# Patient Record
Sex: Male | Born: 1975 | Race: White | Hispanic: No | Marital: Single | Smoking: Current every day smoker
Health system: Southern US, Community
[De-identification: ages and names within clinical notes are randomized; demographics above are authoritative.]

## PROBLEM LIST (undated history)

## (undated) DIAGNOSIS — C140 Malignant neoplasm of pharynx, unspecified: Secondary | ICD-10-CM

---
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
## 2011-05-09 ENCOUNTER — Emergency Department: Payer: Self-pay | Admitting: Emergency Medicine

## 2011-05-09 LAB — CBC
HCT: 42.5 % (ref 40.0–52.0)
MCH: 31 pg (ref 26.0–34.0)
MCV: 92 fL (ref 80–100)
RDW: 13.1 % (ref 11.5–14.5)
WBC: 6.4 10*3/uL (ref 3.8–10.6)

## 2011-05-09 LAB — URINALYSIS, COMPLETE
Bilirubin,UR: NEGATIVE
Blood: NEGATIVE
Ph: 7 (ref 4.5–8.0)
RBC,UR: NONE SEEN /HPF (ref 0–5)
Specific Gravity: 1.013 (ref 1.003–1.030)
WBC UR: 3 /HPF (ref 0–5)

## 2011-05-09 LAB — COMPREHENSIVE METABOLIC PANEL
Alkaline Phosphatase: 134 U/L (ref 50–136)
Anion Gap: 8 (ref 7–16)
BUN: 12 mg/dL (ref 7–18)
Co2: 30 mmol/L (ref 21–32)
EGFR (African American): >60
Glucose: 122 mg/dL — ABNORMAL HIGH (ref 65–99)
Osmolality: 284 (ref 275–301)
Potassium: 4.2 mmol/L (ref 3.5–5.1)
SGPT (ALT): 32 U/L
Sodium: 142 mmol/L (ref 136–145)

## 2011-05-09 LAB — LIPASE, BLOOD: Lipase: 59 U/L — ABNORMAL LOW (ref 73–393)

## 2011-05-10 ENCOUNTER — Emergency Department: Payer: Self-pay | Admitting: Emergency Medicine

## 2012-09-18 ENCOUNTER — Emergency Department: Payer: Self-pay | Admitting: Internal Medicine

## 2013-10-27 ENCOUNTER — Emergency Department: Payer: Self-pay | Admitting: Emergency Medicine

## 2013-10-30 ENCOUNTER — Emergency Department: Payer: Self-pay | Admitting: Emergency Medicine

## 2014-10-17 IMAGING — CT CT THORACIC SPINE WITHOUT CONTRAST
1 series · 12 of 14 positions shown, 15 images · non-contrast
Comparison: none

REASON FOR EXAM: T11-05-10 decreased height.  severe back pain x 2 day
no hx of injury   flex 4
COMMENTS:   LMP: (Male)

[Series 2: bone · axial · 0.34mm/px · z∈[-746,-458]mm · 12 of 114 slices shown, 15 images]
[im 9/114  soft-tissue]
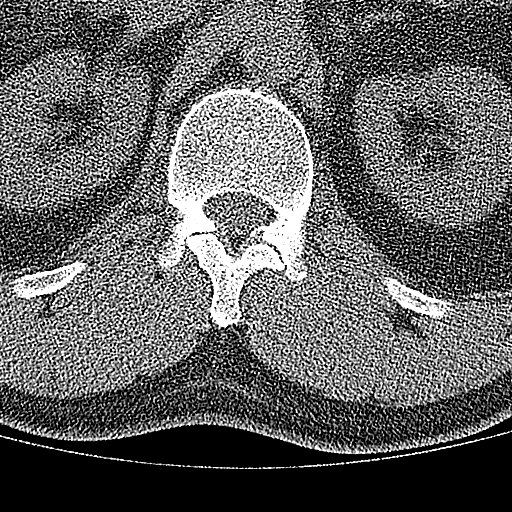
[im 9/114  bone]
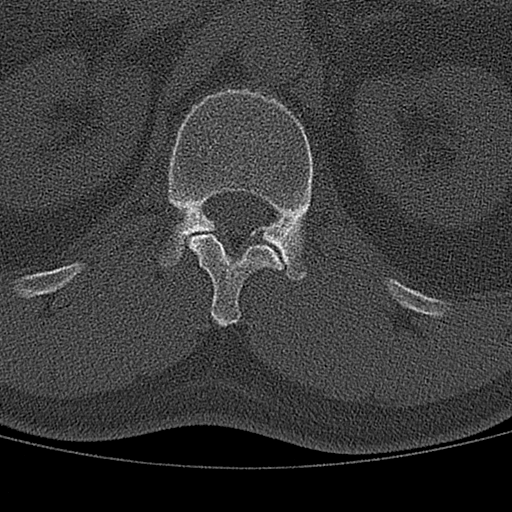
[im 18/114  bone]
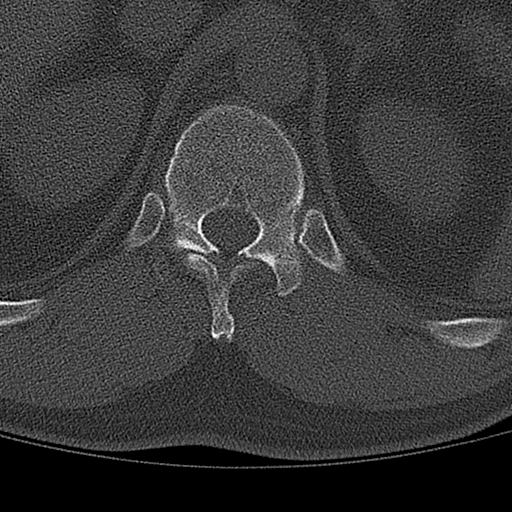
[im 27/114  bone]
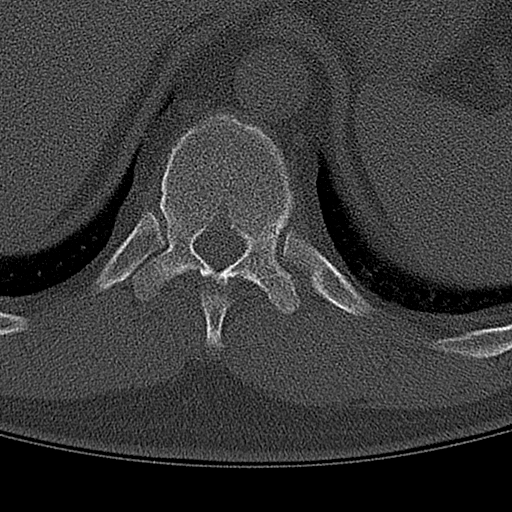
[im 35/114  bone]
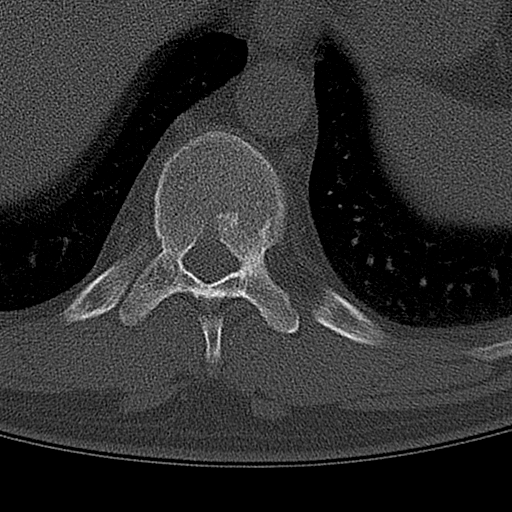
[im 44/114  soft-tissue]
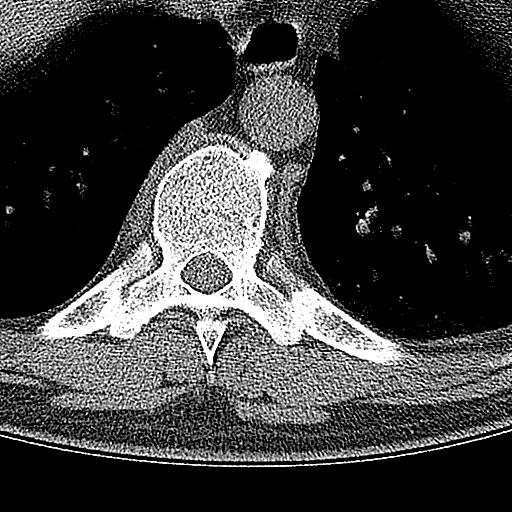
[im 44/114  bone]
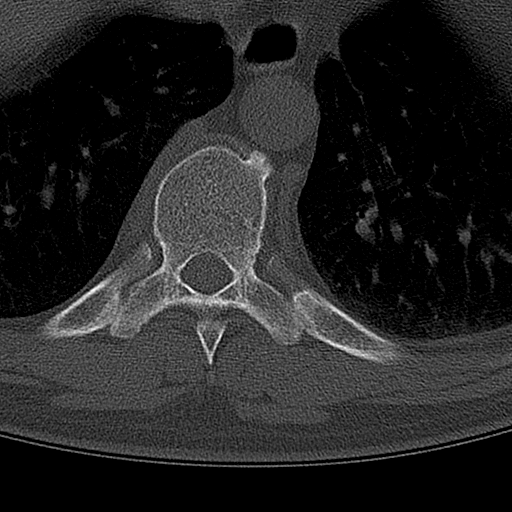
[im 53/114  bone]
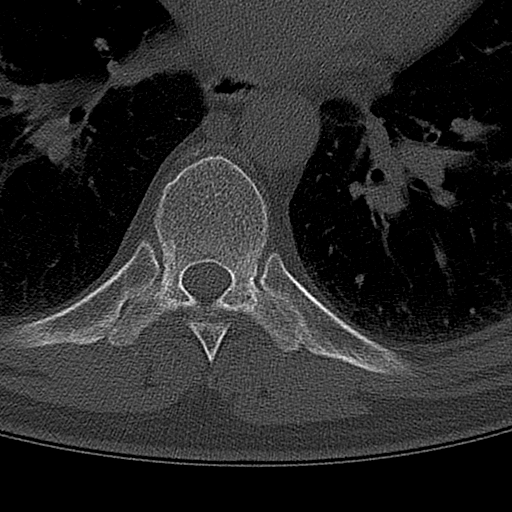
[im 61/114  bone]
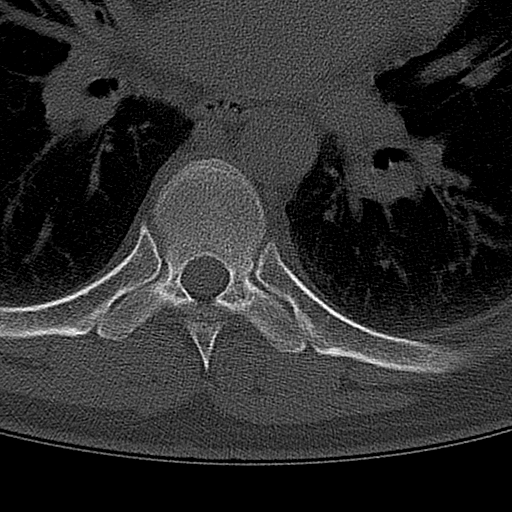
[im 70/114  bone]
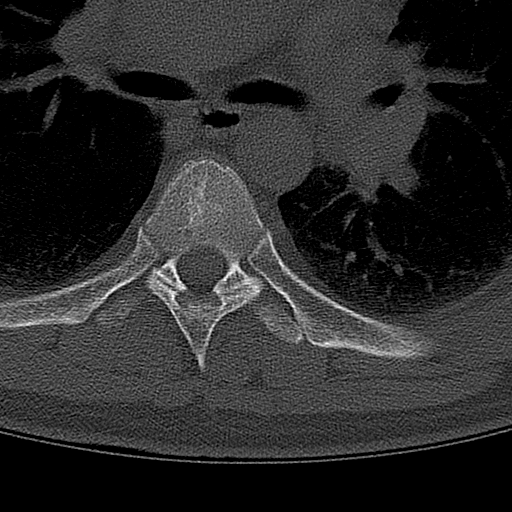
[im 79/114  soft-tissue]
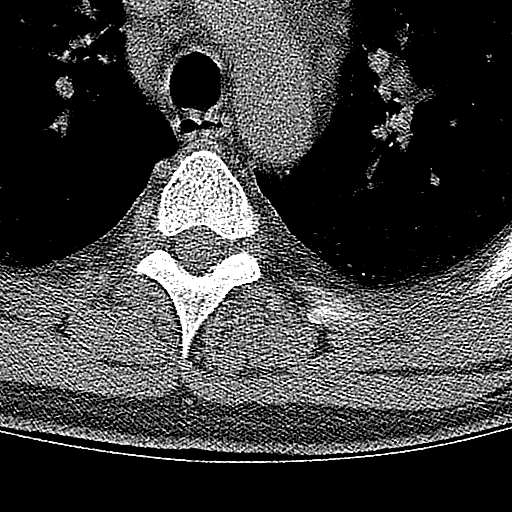
[im 79/114  bone]
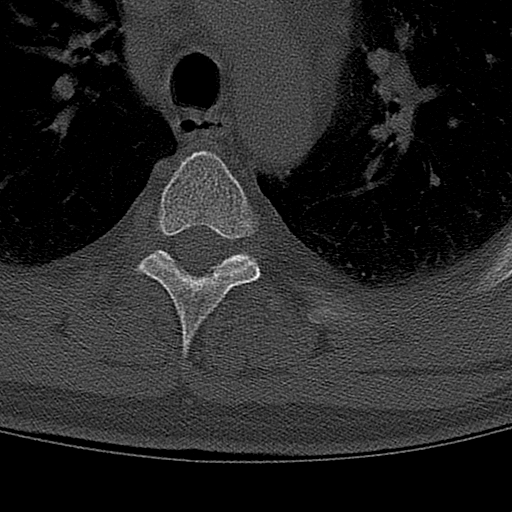
[im 87/114  bone]
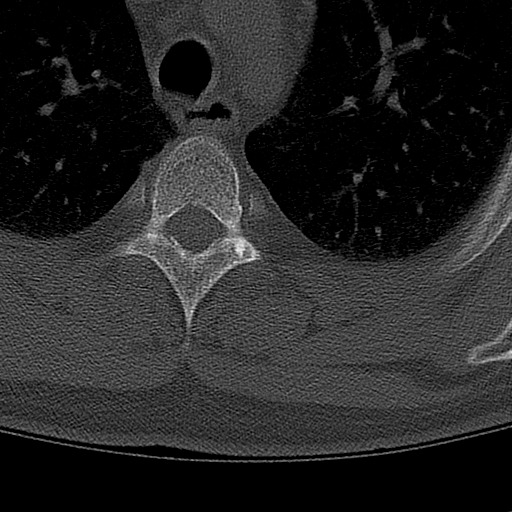
[im 96/114  bone]
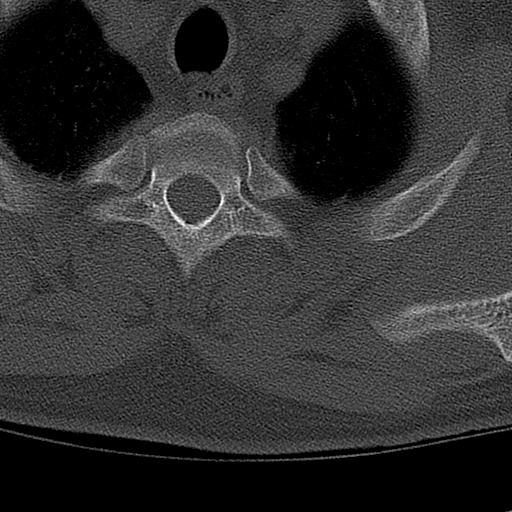
[im 105/114  bone]
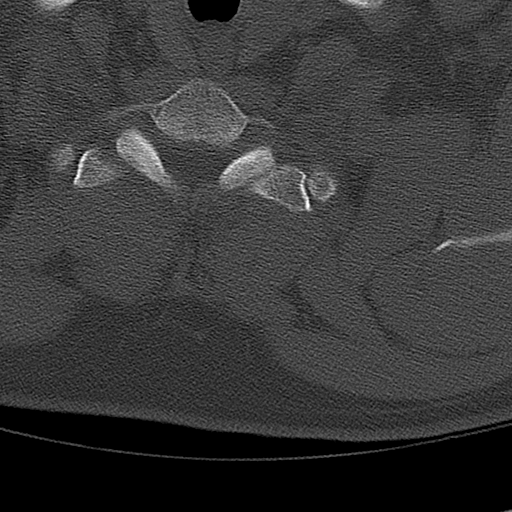

[12 of 14 positions shown; findings below may reference images not displayed]

PROCEDURE:     CT  - CT THORACIC SPINE WO  - September 18, 2012  [DATE]

RESULT:     Sagittal, axial, coronal noncontrast CT images through the
thoracic spine are reviewed. A bone algorithm was employed.

The thoracic vertebral bodies are reasonably well maintained in height.
There is no evidence of an acute or chronic compression fracture. The lower
thoracic vertebral bodies are generally less tall than the upper thoracic
vertebral bodies but the AP dimension is greater. There are Schmorl's nodes
in several mid thoracic levels. No HNP is demonstrated. No abnormal
paravertebral soft tissue densities are demonstrated. There is a left
lateral near bridging osteophyte at T9-T10.
IMPRESSION: There is no evidence of an acute compression fracture.
There are mild degenerative disc changes in the mid and lower thoracic
spine.

[REDACTED]

## 2015-03-23 ENCOUNTER — Emergency Department
Admission: EM | Admit: 2015-03-23 | Discharge: 2015-03-23 | Disposition: A | Discharge status: Home / Self Care | Payer: Self-pay | Attending: Emergency Medicine | Admitting: Emergency Medicine

## 2015-03-23 ENCOUNTER — Emergency Department: Payer: Self-pay

## 2015-03-23 ENCOUNTER — Encounter: Payer: Self-pay | Admitting: Emergency Medicine

## 2015-03-23 DIAGNOSIS — F17200 Nicotine dependence, unspecified, uncomplicated: Secondary | ICD-10-CM | POA: Insufficient documentation

## 2015-03-23 DIAGNOSIS — R079 Chest pain, unspecified: Secondary | ICD-10-CM | POA: Insufficient documentation

## 2015-03-23 LAB — BASIC METABOLIC PANEL
ANION GAP: 6 (ref 5–15)
BUN: 9 mg/dL (ref 6–20)
CO2: 29 mmol/L (ref 22–32)
Calcium: 8.8 mg/dL — ABNORMAL LOW (ref 8.9–10.3)
Chloride: 101 mmol/L (ref 101–111)
Creatinine, Ser: 1.14 mg/dL (ref 0.61–1.24)
GLUCOSE: 104 mg/dL — AB (ref 65–99)
POTASSIUM: 4.3 mmol/L (ref 3.5–5.1)
Sodium: 136 mmol/L (ref 135–145)

## 2015-03-23 LAB — CBC
HEMATOCRIT: 44 % (ref 40.0–52.0)
HEMOGLOBIN: 15 g/dL (ref 13.0–18.0)
MCH: 31.4 pg (ref 26.0–34.0)
MCHC: 34.1 g/dL (ref 32.0–36.0)
MCV: 92.1 fL (ref 80.0–100.0)
Platelets: 214 10*3/uL (ref 150–440)
RBC: 4.78 MIL/uL (ref 4.40–5.90)
RDW: 14 % (ref 11.5–14.5)
WBC: 8.8 10*3/uL (ref 3.8–10.6)

## 2015-03-23 LAB — TROPONIN I: Troponin I: <0.03 ng/mL (ref <0.031)

## 2015-03-23 NOTE — ED Provider Notes (Signed)
[facility] Emergency Department Provider Note     Time seen: ----------------------------------------- 2:30 PM on 03/23/2015 -----------------------------------------    I have reviewed the triage vital signs and the nursing notes.   HISTORY  Chief Complaint Chest Pain    HPI Dustin Webb is a 40 y.o. male who presents the ER stating he had chest pain 12 days ago, recurrent chest tightness yesterday.Patient describes tightness in his chest that radiated into his left arm, states it was his quick onset of snapping her fingers and his quick offset as having affairs again. Family had convinced him to come to the ER for evaluation. Has never had chest pain before, admits to being under a lot stress. He did not have any other associated symptoms.   History reviewed. No pertinent past medical history.  There are no active problems to display for this patient.   History reviewed. No pertinent past surgical history.  Allergies Review of patient's allergies indicates no known allergies.  Social History Social History  Substance Use Topics  . Smoking status: Current Every Day Smoker  . Smokeless tobacco: None  . Alcohol Use: None    Review of Systems Constitutional: Negative for fever. Eyes: Negative for visual changes. ENT: Negative for sore throat. Cardiovascular: positive for chest pain  Respiratory:  Negative for shortness of breath. Gastrointestinal: Negative for abdominal pain, vomiting and diarrhea. Genitourinary: Negative for dysuria. Musculoskeletal: Negative for back pain. Skin: Negative for rash. Neurological: Negative for headaches, focal weakness or numbness.  10-point ROS otherwise negative.  ____________________________________________   PHYSICAL EXAM:  VITAL SIGNS: ED Triage Vitals  Enc Vitals Group     BP --      Pulse --      Resp --      Temp --      Temp src --      SpO2 --      Weight --      Height --      Head Cir --      Peak Flow --      Pain Score --      Pain Loc --      Pain Edu? --      Excl. in [place]? --     Constitutional: Alert and oriented. Well appearing and in no distress. Eyes: Conjunctivae are normal. PERRL. Normal extraocular movements. ENT   Head: Normocephalic and atraumatic.   Nose: No congestion/rhinnorhea.   Mouth/Throat: Mucous membranes are moist.   Neck: No stridor. Cardiovascular: Normal rate, regular rhythm. Normal and symmetric distal pulses are present in all extremities. No murmurs, rubs, or gallops. Respiratory: Normal respiratory effort without tachypnea nor retractions. Breath sounds are clear and equal bilaterally. No wheezes/rales/rhonchi. Gastrointestinal: Soft and nontender. No distention. No abdominal bruits.  Musculoskeletal: Nontender with normal range of motion in all extremities. No joint effusions.  No lower extremity tenderness nor edema. Neurologic:  Normal speech and language. No gross focal neurologic deficits are appreciated. Speech is normal. No gait instability. Skin:  Skin is warm, dry and intact. No rash noted. Psychiatric: Mood and affect are normal. Speech and behavior are normal. Patient exhibits appropriate insight and judgment. ____________________________________________  EKG: Interpreted by me. Normal sinus rhythm with rate of 91 bpm, normal PR interval, normal QRS, normal QT interval. Normal axis.  ____________________________________________  ED COURSE:  Pertinent labs & imaging results that were available during my care of the patient were reviewed by me and considered in my medical decision making (  see chart for details).  patient's distress, will check cardiac labs and reevaluate.  ____________________________________________    LABS (pertinent positives/negatives)  Labs Reviewed  BASIC METABOLIC PANEL - Abnormal; Notable for the following:    Glucose, Bld 104 (*)    Calcium 8.8 (*)    All other  components within normal limits  CBC  TROPONIN I    RADIOLOGY   Chest x-ray  Is unremarkable ____________________________________________  FINAL ASSESSMENT AND PLAN   CHEST PAIN  Plan: Patient with labs and imaging as dictated above. Patient is low risk for ACS, we'll refer him to cardiology for follow-up.   Earleen Newport, MD   Earleen Newport, MD 03/23/15 [phone]

## 2015-03-23 NOTE — ED Notes (Signed)
Had chest pain 12 days ago , with recurrent chest tightness last being yesterday

## 2015-03-23 NOTE — Discharge Instructions (Signed)
Nonspecific Chest Pain  °Chest pain can be caused by many different conditions. There is always a chance that your pain could be related to something serious, such as a heart attack or a blood clot in your lungs. Chest pain can also be caused by conditions that are not life-threatening. If you have chest pain, it is very important to follow up with your health care provider. °CAUSES  °Chest pain can be caused by: °· Heartburn. °· Pneumonia or bronchitis. °· Anxiety or stress. °· Inflammation around your heart (pericarditis) or lung (pleuritis or pleurisy). °· A blood clot in your lung. °· A collapsed lung (pneumothorax). It can develop suddenly on its own (spontaneous pneumothorax) or from trauma to the chest. °· Shingles infection (varicella-zoster virus). °· Heart attack. °· Damage to the bones, muscles, and cartilage that make up your chest wall. This can include: °¨ Bruised bones due to injury. °¨ Strained muscles or cartilage due to frequent or repeated coughing or overwork. °¨ Fracture to one or more ribs. °¨ Sore cartilage due to inflammation (costochondritis). °RISK FACTORS  °Risk factors for chest pain may include: °· Activities that increase your risk for trauma or injury to your chest. °· Respiratory infections or conditions that cause frequent coughing. °· Medical conditions or overeating that can cause heartburn. °· Heart disease or family history of heart disease. °· Conditions or health behaviors that increase your risk of developing a blood clot. °· Having had chicken pox (varicella zoster). °SIGNS AND SYMPTOMS °Chest pain can feel like: °· Burning or tingling on the surface of your chest or deep in your chest. °· Crushing, pressure, aching, or squeezing pain. °· Dull or sharp pain that is worse when you move, cough, or take a deep breath. °· Pain that is also felt in your back, neck, shoulder, or arm, or pain that spreads to any of these areas. °Your chest pain may come and go, or it may stay  constant. °DIAGNOSIS °Lab tests or other studies may be needed to find the cause of your pain. Your health care provider may have you take a test called an ambulatory ECG (electrocardiogram). An ECG records your heartbeat patterns at the time the test is performed. You may also have other tests, such as: °· Transthoracic echocardiogram (TTE). During echocardiography, sound waves are used to create a picture of all of the heart structures and to look at how blood flows through your heart. °· Transesophageal echocardiogram (TEE). This is a more advanced imaging test that obtains images from inside your body. It allows your health care provider to see your heart in finer detail. °· Cardiac monitoring. This allows your health care provider to monitor your heart rate and rhythm in real time. °· Holter monitor. This is a portable device that records your heartbeat and can help to diagnose abnormal heartbeats. It allows your health care provider to track your heart activity for several days, if needed. °· Stress tests. These can be done through exercise or by taking medicine that makes your heart beat more quickly. °· Blood tests. °· Imaging tests. °TREATMENT  °Your treatment depends on what is causing your chest pain. Treatment may include: °· Medicines. These may include: °¨ Acid blockers for heartburn. °¨ Anti-inflammatory medicine. °¨ Pain medicine for inflammatory conditions. °¨ Antibiotic medicine, if an infection is present. °¨ Medicines to dissolve blood clots. °¨ Medicines to treat coronary artery disease. °· Supportive care for conditions that do not require medicines. This may include: °¨ Resting. °¨ Applying heat   or cold packs to injured areas. °¨ Limiting activities until pain decreases. °HOME CARE INSTRUCTIONS °· If you were prescribed an antibiotic medicine, finish it all even if you start to feel better. °· Avoid any activities that bring on chest pain. °· Do not use any tobacco products, including  cigarettes, chewing tobacco, or electronic cigarettes. If you need help quitting, ask your health care provider. °· Do not drink alcohol. °· Take medicines only as directed by your health care provider. °· Keep all follow-up visits as directed by your health care provider. This is important. This includes any further testing if your chest pain does not go away. °· If heartburn is the cause for your chest pain, you may be told to keep your head raised (elevated) while sleeping. This reduces the chance that acid will go from your stomach into your esophagus. °· Make lifestyle changes as directed by your health care provider. These may include: °¨ Getting regular exercise. Ask your health care provider to suggest some activities that are safe for you. °¨ Eating a heart-healthy diet. A registered dietitian can help you to learn healthy eating options. °¨ Maintaining a healthy weight. °¨ Managing diabetes, if necessary. °¨ Reducing stress. °SEEK MEDICAL CARE IF: °· Your chest pain does not go away after treatment. °· You have a rash with blisters on your chest. °· You have a fever. °SEEK IMMEDIATE MEDICAL CARE IF:  °· Your chest pain is worse. °· You have an increasing cough, or you cough up blood. °· You have severe abdominal pain. °· You have severe weakness. °· You faint. °· You have chills. °· You have sudden, unexplained chest discomfort. °· You have sudden, unexplained discomfort in your arms, back, neck, or jaw. °· You have shortness of breath at any time. °· You suddenly start to sweat, or your skin gets clammy. °· You feel nauseous or you vomit. °· You suddenly feel light-headed or dizzy. °· Your heart begins to beat quickly, or it feels like it is skipping beats. °These symptoms may represent a serious problem that is an emergency. Do not wait to see if the symptoms will go away. Get medical help right away. Call your local emergency services (911 in the [place] Do not drive yourself to the hospital. °  °This  information is not intended to replace advice given to you by your health care provider. Make sure you discuss any questions you have with your health care provider. °  °Document Released: 10/21/2004 Document Revised: 02/01/2014 Document Reviewed: 08/17/2013 °Elsevier Interactive Patient Education ©2016 [employer] ° °

## 2016-03-26 ENCOUNTER — Emergency Department
Admission: EM | Admit: 2016-03-26 | Discharge: 2016-03-26 | Disposition: A | Discharge status: Home / Self Care | Payer: BLUE CROSS/BLUE SHIELD | Attending: Emergency Medicine | Admitting: Emergency Medicine

## 2016-03-26 ENCOUNTER — Encounter: Payer: Self-pay | Admitting: Emergency Medicine

## 2016-03-26 DIAGNOSIS — F17200 Nicotine dependence, unspecified, uncomplicated: Secondary | ICD-10-CM | POA: Insufficient documentation

## 2016-03-26 DIAGNOSIS — K029 Dental caries, unspecified: Secondary | ICD-10-CM | POA: Diagnosis not present

## 2016-03-26 DIAGNOSIS — K0889 Other specified disorders of teeth and supporting structures: Secondary | ICD-10-CM | POA: Diagnosis present

## 2016-03-26 DIAGNOSIS — K5909 Other constipation: Secondary | ICD-10-CM

## 2016-03-26 DIAGNOSIS — A691 Other Vincent's infections: Secondary | ICD-10-CM

## 2016-03-26 MED ORDER — METRONIDAZOLE 500 MG PO TABS: 500.0000 mg | ORAL_TABLET | Freq: Three times a day (TID) | ORAL | 0 refills | Status: DC

## 2016-03-26 MED ORDER — PENICILLIN V POTASSIUM 500 MG PO TABS: 500.0000 mg | ORAL_TABLET | Freq: Four times a day (QID) | ORAL | 0 refills | Status: DC

## 2016-03-26 MED ORDER — SENNOSIDES-DOCUSATE SODIUM 8.6-50 MG PO TABS: 2.0000 | ORAL_TABLET | Freq: Every day | ORAL | 0 refills | Status: DC

## 2016-03-26 MED ORDER — METOCLOPRAMIDE HCL 10 MG PO TABS: 10.0000 mg | ORAL_TABLET | Freq: Four times a day (QID) | ORAL | 0 refills | Status: DC | PRN

## 2016-03-26 NOTE — ED Provider Notes (Signed)
[facility] Emergency Department Provider Note  ____________________________________________  Time seen: Approximately 10:39 AM  I have reviewed the triage vital signs and the nursing notes.   HISTORY  Chief Complaint Dental Pain    HPI Dustin Webb is a 41 y.o. male who complains of dental pain since yesterday which was gradual onset. This morning when he woke up he noticed that the right side of his jaw was also swollen. Denies any difficulty swallowing or breathing. No fever or chills or sweats. No eye pain or vision changes. 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  Patient has chronic constipation, reports last bowel movement 8 days ago. He is under the care of his primary care doctor for this and tried multiple different dietary supplements and laxatives.  Not currently taking anything. Tolerating oral intake without vomiting. Has persistent nausea over greater than a week.   History reviewed. No pertinent past medical history. Chronic constipation  There are no active problems to display for this patient.    History reviewed. No pertinent surgical history. Remote eye surgery  Prior to Admission medications   Medication Sig Start Date End Date Taking? Authorizing Provider  metoCLOPramide (REGLAN) 10 MG tablet Take 1 tablet (10 mg total) by mouth every 6 (six) hours as needed. 03/26/16   Carrie Mew, MD  metroNIDAZOLE (FLAGYL) 500 MG tablet Take 1 tablet (500 mg total) by mouth 3 (three) times daily. 03/26/16   Carrie Mew, MD  penicillin v potassium (VEETID) 500 MG tablet Take 1 tablet (500 mg total) by mouth 4 (four) times daily. 03/26/16   Carrie Mew, MD  senna-docusate (SENOKOT-S) 8.6-50 MG tablet Take 2 tablets by mouth daily. 03/26/16   Carrie Mew, MD     Allergies Patient has no known allergies.   History reviewed. No pertinent family  history.  Social History Social History  Substance Use Topics  . Smoking status: Current Every Day Smoker  . Smokeless tobacco: Never Used  . Alcohol use No    Review of Systems  Constitutional:   No fever or chills.  ENT:   No sore throat. No rhinorrhea. Cardiovascular:   No chest pain. Respiratory:   No dyspnea or cough. Gastrointestinal:   Negative for abdominal pain, vomiting and diarrhea. Subacute to chronic nausea and constipation. 10-point ROS otherwise negative.  ____________________________________________   PHYSICAL EXAM:  VITAL SIGNS: ED Triage Vitals  Enc Vitals Group     BP 03/26/16 1012 (!) 168/91     Pulse Rate 03/26/16 1012 82     Resp 03/26/16 1012 18     Temp 03/26/16 1012 98.3 F (36.8 C)     Temp Source 03/26/16 1012 Oral     SpO2 03/26/16 1012 98 %     Weight 03/26/16 1013 230 lb (104.3 kg)     Height 03/26/16 1013 5\' 11"  (1.803 m)     Head Circumference --      Peak Flow --      Pain Score 03/26/16 1009 10     Pain Loc --      Pain Edu? --      Excl. in [place]? --     Vital signs reviewed, nursing assessments reviewed.   Constitutional:   Alert and oriented. Well appearing and in no distress. Eyes:   No scleral icterus. No conjunctival pallor. PERRL. EOMIAnd painless.  No nystagmus. ENT   Head:  Normocephalic and atraumatic. There is mild swelling of the right face over the mandible. TMs normal, external canals normal   Nose:   No congestion/rhinnorhea. No septal hematoma   Mouth/Throat:   MMM, no pharyngeal erythema. No peritonsillar mass. Diffuse dental decay with multiple necrotic stumps of teeth. The central and lateral incisors are present on the lower, with diffuse necrotizing ulcerative plaques at the gingiva. Teeth are not loose. Not tender. In the right lower jaw, there is gingival swelling and tenderness. No fluctuance or purulent drainage.   Neck:   No stridor. No SubQ emphysema. No meningismus. No mass in the  submandibular or submental space. Hematological/Lymphatic/Immunilogical:   No cervical lymphadenopathy. Cardiovascular:   RRR. Symmetric bilateral radial and DP pulses.  No murmurs.  Respiratory:   Normal respiratory effort without tachypnea nor retractions. Breath sounds are clear and equal bilaterally. No wheezes/rales/rhonchi. Gastrointestinal:   Soft and nontender. Non distended. There is no CVA tenderness.  No rebound, rigidity, or guarding. Not tympanitic Neurologic:   Normal speech and language.  CN 2-10 normal. Motor grossly intact. No gross focal neurologic deficits are appreciated.   ____________________________________________    LABS (pertinent positives/negatives) (all labs ordered are listed, but only abnormal results are displayed) Labs Reviewed - No data to display ____________________________________________   EKG    ____________________________________________    RADIOLOGY  No results found.  ____________________________________________   PROCEDURES Procedures  ____________________________________________   INITIAL IMPRESSION / ASSESSMENT AND PLAN / ED COURSE  Pertinent labs & imaging results that were available during my care of the patient were reviewed by me and considered in my medical decision making (see chart for details).  Patient presents with acute gingivitis with facial swelling in the setting of a periodontal infection. No evidence of abscess at this time. Patient is nontoxic, well-appearing, unremarkable vital signs. No evidence of airway involvement or eye involvement. He has very poor dentition, and exam also suggests possible necrotizing ulcerative gingivitis. For these issues, I'll prescribe him penicillin and Flagyl. Also Reglan and Senokot-S for his nausea and chronic constipation. I also recommended he pick up MiraLAX and continue following up with his doctor regarding this issue.   Considering the patient's symptoms, medical  history, and physical examination today, I have low suspicion for cholecystitis or biliary pathology, pancreatitis, perforation or bowel obstruction, hernia, intra-abdominal abscess, AAA or dissection, volvulus or intussusception, mesenteric ischemia, or appendicitis.           ____________________________________________   FINAL CLINICAL IMPRESSION(S) / ED DIAGNOSES  Final diagnoses:  Dental caries  Acute necrotizing ulcerative gingivitis  Chronic constipation      New Prescriptions   METOCLOPRAMIDE (REGLAN) 10 MG TABLET    Take 1 tablet (10 mg total) by mouth every 6 (six) hours as needed.   METRONIDAZOLE (FLAGYL) 500 MG TABLET    Take 1 tablet (500 mg total) by mouth 3 (three) times daily.   PENICILLIN V POTASSIUM (VEETID) 500 MG TABLET    Take 1 tablet (500 mg total) by mouth 4 (four) times daily.   SENNA-DOCUSATE (SENOKOT-S) 8.6-50 MG TABLET    Take 2 tablets by mouth daily.     Portions of this note were generated with dragon dictation software. Dictation errors may occur despite best attempts at proofreading.    Carrie Mew, MD 03/26/16 1047

## 2016-03-26 NOTE — ED Triage Notes (Signed)
Pt to ed with c/o right sided toothache x 2 days. Swelling noted to right side of face.

## 2016-07-15 ENCOUNTER — Other Ambulatory Visit: Payer: Self-pay

## 2016-08-04 ENCOUNTER — Ambulatory Visit (INDEPENDENT_AMBULATORY_CARE_PROVIDER_SITE_OTHER): Payer: BLUE CROSS/BLUE SHIELD | Admitting: Urology

## 2016-08-04 ENCOUNTER — Encounter: Payer: Self-pay | Admitting: Urology

## 2016-08-04 VITALS — BP 131/77 | HR 77 | Ht 71.0 in | Wt 243.5 lb

## 2016-08-04 DIAGNOSIS — E291 Testicular hypofunction: Secondary | ICD-10-CM

## 2016-08-04 LAB — URINALYSIS, COMPLETE
Bilirubin, UA: NEGATIVE
Glucose, UA: NEGATIVE
Ketones, UA: NEGATIVE
LEUKOCYTES UA: NEGATIVE
Nitrite, UA: NEGATIVE
PROTEIN UA: NEGATIVE
Specific Gravity, UA: <1.005 — ABNORMAL LOW (ref 1.005–1.030)
Urobilinogen, Ur: 0.2 mg/dL (ref 0.2–1.0)
pH, UA: 6 (ref 5.0–7.5)

## 2016-08-04 LAB — MICROSCOPIC EXAMINATION: Bacteria, UA: NONE SEEN

## 2016-08-04 NOTE — Progress Notes (Signed)
08/04/2016 1:55 PM   Dustin Webb 1975/12/25 [id number]  Referring provider: Center, [facility] [place], [place] [address]  Chief Complaint  Patient presents with  . New Patient (Initial Visit)    Hypofunction    HPI: The patient's is a 41 year old gentleman who is on chronic methadone presents today for evaluation of his hypogonadism.  He has been on testosterone injections in the past though is not currently taking them. It is unclear what his testosterone level is at baseline. His primary care provider checked a free testosterone recently but not a total testosterone. The patient does state that every time his testosterone has been checked that it was low. He currently complains of severe fatigue, decreased libido, erectile dysfunction. He is very bothered by this. He has no children. He is not currently interested in having a child at any point. When he was on the injections, he noticed a little improvement though he was getting them only once per month and often did not take a regular basis. He is interested in resuming therapy at this point.   PMH: History reviewed. No pertinent past medical history.  Surgical History: Past Surgical History:  Procedure Laterality Date  . CATARACT EXTRACTION Right 1976    Home Medications:  Allergies as of 08/04/2016   No Known Allergies     Medication List       Accurate as of 08/04/16  1:55 PM. Always use your most recent med list.          methadone 10 MG/ML solution Commonly known as:  DOLOPHINE Place 100 mg into feeding tube daily.   metoCLOPramide 10 MG tablet Commonly known as:  REGLAN Take 1 tablet (10 mg total) by mouth every 6 (six) hours as needed.   metroNIDAZOLE 500 MG tablet Commonly known as:  FLAGYL Take 1 tablet (500 mg total) by mouth 3 (three) times daily.   penicillin v potassium 500 MG tablet Commonly known as:  VEETID Take 1 tablet (500 mg total) by mouth 4 (four)  times daily.   senna-docusate 8.6-50 MG tablet Commonly known as:  Senokot-S Take 2 tablets by mouth daily.       Allergies: No Known Allergies  Family History: Family History  Problem Relation Age of Onset  . Prostate cancer Neg Hx   . Bladder Cancer Neg Hx   . Kidney cancer Neg Hx     Social History:  reports that he has been smoking.  He has never used smokeless tobacco. He reports that he does not drink alcohol or use drugs.  ROS: UROLOGY Frequent Urination?: Yes Hard to postpone urination?: No Burning/pain with urination?: No Get up at night to urinate?: Yes Leakage of urine?: No Urine stream starts and stops?: No Trouble starting stream?: No Do you have to strain to urinate?: No Blood in urine?: No Urinary tract infection?: No Sexually transmitted disease?: No Injury to kidneys or bladder?: No Painful intercourse?: No Weak stream?: No Erection problems?: Yes Penile pain?: No  Gastrointestinal Nausea?: No Vomiting?: No Indigestion/heartburn?: No Diarrhea?: No Constipation?: No  Constitutional Fever: No Night sweats?: No Weight loss?: No Fatigue?: No  Skin Skin rash/lesions?: No Itching?: No  Eyes Blurred vision?: No Double vision?: No  Ears/Nose/Throat Sore throat?: No Sinus problems?: No  Hematologic/Lymphatic Swollen glands?: No Easy bruising?: No  Cardiovascular Leg swelling?: No Chest pain?: No  Respiratory Cough?: No Shortness of breath?: No  Endocrine Excessive thirst?: No  Musculoskeletal Back pain?: No Joint  pain?: No  Neurological Headaches?: No Dizziness?: No  Psychologic Depression?: No Anxiety?: No  Physical Exam: BP 131/77 (BP Location: Left Arm, Patient Position: Sitting, Cuff Size: Normal)   Pulse 77   Ht 5\' 11"  (1.803 m)   Wt 243 lb 8 oz (110.5 kg)   BMI 33.96 kg/m   Constitutional:  Alert and oriented, No acute distress. HEENT: [place] AT, moist mucus membranes.  Trachea midline, no  masses. Cardiovascular: No clubbing, cyanosis, or edema. Respiratory: Normal respiratory effort, no increased work of breathing. GI: Abdomen is soft, nontender, nondistended, no abdominal masses GU: No CVA tenderness. Normal phallus. Testicles descended bilaterally. Benign. Skin: No rashes, bruises or suspicious lesions. Lymph: No cervical or inguinal adenopathy. Neurologic: Grossly intact, no focal deficits, moving all 4 extremities. Psychiatric: Normal mood and affect.  Laboratory Data: Lab Results  Component Value Date   WBC 8.8 03/23/2015   HGB 15.0 03/23/2015   HCT 44.0 03/23/2015   MCV 92.1 03/23/2015   PLT 214 03/23/2015    Lab Results  Component Value Date   CREATININE 1.14 03/23/2015    No results found for: PSA  No results found for: TESTOSTERONE  No results found for: HGBA1C  Urinalysis    Component Value Date/Time   COLORURINE Yellow 05/09/2011 1750   APPEARANCEUR Clear 05/09/2011 1750   LABSPEC 1.013 05/09/2011 1750   PHURINE 7.0 05/09/2011 1750   GLUCOSEU Negative 05/09/2011 1750   HGBUR Negative 05/09/2011 1750   BILIRUBINUR Negative 05/09/2011 1750   KETONESUR Negative 05/09/2011 1750   PROTEINUR Negative 05/09/2011 1750   NITRITE Negative 05/09/2011 1750   LEUKOCYTESUR Trace 05/09/2011 1750    Assessment & Plan:    1. Low testosterone I discussed the role of testosterone replacement therapy as a means treat low testosterone. We discussed different options for this including exogenous testosterone versus Clomid. He does understand the advantages Clomid his anesthetic maintains fertility. He however is not interested in home at this time and would like to try testosterone injections. He does understand that this will decrease his fertility. He also understands other risks of testosterone therapy include stroke, cardiovascular disease, theoretical risk of promoting prostate cancer, need for frequent follow-up. At this point, we will confirm his low  testosterone with 2 AM testosterone levels. We'll also order standard pre-testosterone therapy labs. We will call him with the results and start him on injection therapy if appropriate. If started on medication, we'll plan for follow-up with labs 3 months after initiation of therapy.  Nickie Retort, MD  [facility] [address], [place], [place] [address] [phone]

## 2016-08-10 ENCOUNTER — Other Ambulatory Visit: Payer: BLUE CROSS/BLUE SHIELD

## 2016-08-10 DIAGNOSIS — E291 Testicular hypofunction: Secondary | ICD-10-CM

## 2016-08-11 LAB — TESTOSTERONE: Testosterone: 110 ng/dL — ABNORMAL LOW (ref 264–916)

## 2016-08-17 ENCOUNTER — Other Ambulatory Visit: Payer: BLUE CROSS/BLUE SHIELD

## 2016-08-17 DIAGNOSIS — E291 Testicular hypofunction: Secondary | ICD-10-CM

## 2016-08-18 ENCOUNTER — Telehealth: Payer: Self-pay | Admitting: Family Medicine

## 2016-08-18 LAB — HEPATIC FUNCTION PANEL
ALK PHOS: 139 IU/L — AB (ref 39–117)
ALT: 26 IU/L (ref 0–44)
AST: 23 IU/L (ref 0–40)
Albumin: 4.4 g/dL (ref 3.5–5.5)
BILIRUBIN, DIRECT: 0.15 mg/dL (ref 0.00–0.40)
Bilirubin Total: 0.5 mg/dL (ref 0.0–1.2)
Total Protein: 7 g/dL (ref 6.0–8.5)

## 2016-08-18 LAB — CBC WITH DIFFERENTIAL
BASOS: 0 %
Basophils Absolute: 0 10*3/uL (ref 0.0–0.2)
EOS (ABSOLUTE): 0.1 10*3/uL (ref 0.0–0.4)
EOS: 1 %
HEMOGLOBIN: 15.7 g/dL (ref 13.0–17.7)
Hematocrit: 43.7 % (ref 37.5–51.0)
IMMATURE GRANS (ABS): 0 10*3/uL (ref 0.0–0.1)
Immature Granulocytes: 0 %
LYMPHS: 19 %
Lymphocytes Absolute: 1.8 10*3/uL (ref 0.7–3.1)
MCH: 31.4 pg (ref 26.6–33.0)
MCHC: 35.9 g/dL — ABNORMAL HIGH (ref 31.5–35.7)
MCV: 87 fL (ref 79–97)
MONOCYTES: 7 %
Monocytes Absolute: 0.6 10*3/uL (ref 0.1–0.9)
NEUTROS ABS: 6.8 10*3/uL (ref 1.4–7.0)
NEUTROS PCT: 73 %
RBC: 5 x10E6/uL (ref 4.14–5.80)
RDW: 13.5 % (ref 12.3–15.4)
WBC: 9.3 10*3/uL (ref 3.4–10.8)

## 2016-08-18 LAB — LIPID PANEL
CHOL/HDL RATIO: 5.7 ratio — AB (ref 0.0–5.0)
Cholesterol, Total: 171 mg/dL (ref 100–199)
HDL: 30 mg/dL — AB (ref >39)
LDL Calculated: 115 mg/dL — ABNORMAL HIGH (ref 0–99)
TRIGLYCERIDES: 132 mg/dL (ref 0–149)
VLDL CHOLESTEROL CAL: 26 mg/dL (ref 5–40)

## 2016-08-18 LAB — TESTOSTERONE,FREE AND TOTAL
TESTOSTERONE FREE: 3 pg/mL — AB (ref 6.8–21.5)
Testosterone: 196 ng/dL — ABNORMAL LOW (ref 264–916)

## 2016-08-18 LAB — FSH/LH
FSH: 3.5 m[IU]/mL (ref 1.5–12.4)
LH: 3.9 m[IU]/mL (ref 1.7–8.6)

## 2016-08-18 LAB — PSA: PROSTATE SPECIFIC AG, SERUM: 0.3 ng/mL (ref 0.0–4.0)

## 2016-08-18 LAB — PROLACTIN: Prolactin: 8 ng/mL (ref 4.0–15.2)

## 2016-08-18 NOTE — Telephone Encounter (Signed)
Left message for patient to return call.

## 2016-08-18 NOTE — Telephone Encounter (Signed)
-----   Message from Nickie Retort, MD sent at 08/18/2016 10:59 AM EDT ----- Please let patient know his labs confirmed low T. Can we set the patient up to receive testosterone cypionate 200 mg q2weeks with both Rx and injection appts? He will then need to see me in 3 months with CBC and Testosterone level prior. Thanks    ----- Message ----- From: Leia Alf, CMA Sent: 08/18/2016   8:21 AM To: Nickie Retort, MD    ----- Message ----- From: Interface, [facility] Lab Results In Sent: 08/18/2016   5:42 AM To: [facility]

## 2016-08-24 NOTE — Telephone Encounter (Signed)
LMOM

## 2016-08-25 MED ORDER — TESTOSTERONE CYPIONATE 200 MG/ML IM SOLN: 200.0000 mg | INTRAMUSCULAR | 0 refills | Status: DC

## 2016-08-25 NOTE — Telephone Encounter (Signed)
Patient notified

## 2016-08-25 NOTE — Telephone Encounter (Signed)
LMOM

## 2016-08-25 NOTE — Addendum Note (Signed)
Addended by: Kyra Manges on: 08/25/2016 01:02 PM   Modules accepted: Orders

## 2016-08-26 ENCOUNTER — Telehealth: Payer: Self-pay

## 2016-08-26 NOTE — Telephone Encounter (Signed)
Spoke with pt in reference to PA. Pt voiced understanding. Will call pt when PA is complete.

## 2016-08-26 NOTE — Telephone Encounter (Signed)
LMOM- Spoke with pharmacy. Testosterone requires a PA. Unless pt wants to pay out of pocket. Pt will not be able to get an injection tomorrow.

## 2016-08-27 ENCOUNTER — Ambulatory Visit: Payer: BLUE CROSS/BLUE SHIELD

## 2016-09-01 ENCOUNTER — Telehealth: Payer: Self-pay | Admitting: Urology

## 2016-09-01 NOTE — Telephone Encounter (Signed)
Pt called asking about PA for testosterone.  I read the last note that someone would call him when we received this.

## 2016-09-08 NOTE — Telephone Encounter (Signed)
Submitted the PA to [facility] and obtained APPROVAL.  Contacted the [employer] on [address] to confirm.  Will be ready for pickup by 3pm today.  Advised patient.  He has an appointment for an injection tomorrow morning.

## 2016-09-09 ENCOUNTER — Ambulatory Visit (INDEPENDENT_AMBULATORY_CARE_PROVIDER_SITE_OTHER): Payer: BLUE CROSS/BLUE SHIELD

## 2016-09-09 DIAGNOSIS — E291 Testicular hypofunction: Secondary | ICD-10-CM | POA: Diagnosis not present

## 2016-09-09 MED ORDER — TESTOSTERONE CYPIONATE 200 MG/ML IM SOLN
200.0000 mg | Freq: Once | INTRAMUSCULAR | Status: AC
  Administered 2016-09-09: 200 mg via INTRAMUSCULAR

## 2016-09-09 NOTE — Progress Notes (Signed)
Testosterone IM Injection  Due to Hypogonadism patient is present today for a Testosterone Injection.  Medication: Testosterone Cypionate Dose: 1mL Location: right upper outer buttocks Lot: [id number] Exp:01/2018  Patient tolerated well, no complications were noted  Preformed by: Chelsea Watkins, LPN   Follow up: 2 weeks 

## 2016-09-23 ENCOUNTER — Ambulatory Visit: Payer: BLUE CROSS/BLUE SHIELD

## 2016-09-28 ENCOUNTER — Ambulatory Visit (INDEPENDENT_AMBULATORY_CARE_PROVIDER_SITE_OTHER): Payer: BLUE CROSS/BLUE SHIELD

## 2016-09-28 DIAGNOSIS — E291 Testicular hypofunction: Secondary | ICD-10-CM

## 2016-09-28 MED ORDER — TESTOSTERONE CYPIONATE 200 MG/ML IM SOLN
200.0000 mg | Freq: Once | INTRAMUSCULAR | Status: AC
  Administered 2016-09-28: 200 mg via INTRAMUSCULAR

## 2016-09-28 NOTE — Progress Notes (Signed)
Testosterone IM Injection  Due to Hypogonadism patient is present today for a Testosterone Injection.  Medication: Testosterone Cypionate Dose: 71mL Location: left upper outer buttocks Lot: [id number] Exp:02/20  Patient tolerated well, no complications were noted  Preformed by: Toniann Fail, LPN   Follow up: 2 weeks

## 2016-09-29 ENCOUNTER — Telehealth: Payer: Self-pay | Admitting: Urology

## 2016-09-29 NOTE — Telephone Encounter (Signed)
Dr. Pilar Jarvis wanted to see Dustin Webb in three months after he started his testosterone therapy.  I do not see an appointment for this appointment in the computer.  Please have him make one.

## 2016-09-30 NOTE — Telephone Encounter (Signed)
Will have pt make appt for November at next injection appt.

## 2016-10-12 ENCOUNTER — Ambulatory Visit (INDEPENDENT_AMBULATORY_CARE_PROVIDER_SITE_OTHER): Payer: BLUE CROSS/BLUE SHIELD

## 2016-10-12 DIAGNOSIS — E291 Testicular hypofunction: Secondary | ICD-10-CM

## 2016-10-12 MED ORDER — TESTOSTERONE CYPIONATE 200 MG/ML IM SOLN
200.0000 mg | Freq: Once | INTRAMUSCULAR | Status: AC
  Administered 2016-10-12: 200 mg via INTRAMUSCULAR

## 2016-10-12 NOTE — Progress Notes (Signed)
Testosterone IM Injection  Due to Hypogonadism patient is present today for a Testosterone Injection.  Medication: Testosterone Cypionate Dose: 41mL Location: right upper outer buttocks Lot: [id number] Exp:11/2018  Patient tolerated well, no complications were noted  Preformed by: Toniann Fail, LPN   Follow up: 2 weeks. Pt made f/u for Dr. Pilar Jarvis in November.

## 2016-10-26 ENCOUNTER — Ambulatory Visit (INDEPENDENT_AMBULATORY_CARE_PROVIDER_SITE_OTHER): Payer: BLUE CROSS/BLUE SHIELD

## 2016-10-26 DIAGNOSIS — E291 Testicular hypofunction: Secondary | ICD-10-CM

## 2016-10-26 MED ORDER — TESTOSTERONE CYPIONATE 200 MG/ML IM SOLN
200.0000 mg | Freq: Once | INTRAMUSCULAR | Status: AC
  Administered 2016-10-26: 200 mg via INTRAMUSCULAR

## 2016-10-26 NOTE — Progress Notes (Signed)
Testosterone IM Injection  Due to Hypogonadism patient is present today for a Testosterone Injection.  Medication: Testosterone Cypionate Dose: 67mL Location: left upper outer buttocks Lot: [id number] Exp:11/2018  Patient tolerated well, no complications were noted  Preformed by: Toniann Fail, LPN   Follow up: 2 weeks

## 2016-11-09 ENCOUNTER — Ambulatory Visit (INDEPENDENT_AMBULATORY_CARE_PROVIDER_SITE_OTHER): Payer: BLUE CROSS/BLUE SHIELD

## 2016-11-09 DIAGNOSIS — E291 Testicular hypofunction: Secondary | ICD-10-CM

## 2016-11-09 MED ORDER — TESTOSTERONE CYPIONATE 200 MG/ML IM SOLN
200.0000 mg | Freq: Once | INTRAMUSCULAR | Status: AC
  Administered 2016-11-09: 200 mg via INTRAMUSCULAR

## 2016-11-09 NOTE — Progress Notes (Signed)
Testosterone IM Injection  Due to Hypogonadism patient is present today for a Testosterone Injection.  Medication: Testosterone Cypionate Dose: 24mL Location: right upper outer buttocks Lot: [id number] Exp:11/2018  Patient tolerated well, no complications were noted  Preformed by: Toniann Fail, LPN   Follow up: 2 weeks

## 2016-11-23 ENCOUNTER — Ambulatory Visit (INDEPENDENT_AMBULATORY_CARE_PROVIDER_SITE_OTHER): Payer: BLUE CROSS/BLUE SHIELD

## 2016-11-23 DIAGNOSIS — E291 Testicular hypofunction: Secondary | ICD-10-CM | POA: Diagnosis not present

## 2016-11-23 MED ORDER — TESTOSTERONE CYPIONATE 200 MG/ML IM SOLN
200.0000 mg | Freq: Once | INTRAMUSCULAR | Status: AC
  Administered 2016-11-23: 200 mg via INTRAMUSCULAR

## 2016-11-23 NOTE — Progress Notes (Signed)
Testosterone IM Injection  Due to Hypogonadism patient is present today for a Testosterone Injection.  Medication: Testosterone Cypionate Dose: 58mL Location: left upper outer buttocks Lot: [id number] Exp:11/2018  Patient tolerated well, no complications were noted  Preformed by: Toniann Fail, LPN   Follow up: 2 weeks

## 2016-11-25 ENCOUNTER — Ambulatory Visit: Payer: BLUE CROSS/BLUE SHIELD

## 2016-12-03 ENCOUNTER — Ambulatory Visit: Payer: BLUE CROSS/BLUE SHIELD

## 2016-12-06 ENCOUNTER — Encounter: Payer: Self-pay | Admitting: Urology

## 2016-12-06 ENCOUNTER — Ambulatory Visit: Payer: BLUE CROSS/BLUE SHIELD

## 2016-12-10 ENCOUNTER — Encounter: Payer: Self-pay | Admitting: Urology

## 2016-12-10 ENCOUNTER — Ambulatory Visit: Payer: BLUE CROSS/BLUE SHIELD | Admitting: Urology

## 2017-02-03 ENCOUNTER — Ambulatory Visit (INDEPENDENT_AMBULATORY_CARE_PROVIDER_SITE_OTHER): Payer: BLUE CROSS/BLUE SHIELD | Admitting: Urology

## 2017-02-03 ENCOUNTER — Encounter: Payer: Self-pay | Admitting: Urology

## 2017-02-03 VITALS — BP 125/81 | HR 73 | Ht 71.0 in | Wt 241.1 lb

## 2017-02-03 DIAGNOSIS — E291 Testicular hypofunction: Secondary | ICD-10-CM

## 2017-02-03 NOTE — Progress Notes (Signed)
02/03/2017 1:44 PM   Dustin Webb [birth year] [id number]  Referring provider: Center, [facility] [name], [place] [address]  Chief Complaint  Patient presents with  . Hypogonadism    HPI: The patient's is a 42 year old gentleman who is on chronic methadone presents today for follow up of his hypogonadism.  He has been on testosterone injections in the past though is not currently taking them. It is unclear what his testosterone level is at baseline. His primary care provider checked a free testosterone recently but not a total testosterone. The patient does state that every time his testosterone has been checked that it was low. He currently complains of severe fatigue, decreased libido, erectile dysfunction. He is very bothered by this. He has no children. He is not currently interested in having a child at any point. When he was on the injections, he noticed a little improvement though he was getting them only once per month and often did not take a regular basis.   He was resumed on testosterone therapy in July 2018.  He stopped receiving his testosterone injections in October 2018.  He has not been seen in follow-up since that time.  He has had no lab levels drawn since July 2018 due to his noncompliance.  He is unsure of the medication helped him when he was on it.  However again his labs were never rechecked   PMH: History reviewed. No pertinent past medical history.  Surgical History: Past Surgical History:  Procedure Laterality Date  . CATARACT EXTRACTION Right 1976    Home Medications:  Allergies as of 02/03/2017   No Known Allergies     Medication List        Accurate as of 02/03/17  1:44 PM. Always use your most recent med list.          methadone 10 MG/5ML solution Commonly known as:  DOLOPHINE Take by mouth daily.   testosterone cypionate 200 MG/ML injection Commonly known as:  DEPOTESTOSTERONE CYPIONATE Inject 1 mL (200  mg total) into the muscle every 14 (fourteen) days.       Allergies: No Known Allergies  Family History: Family History  Problem Relation Age of Onset  . Prostate cancer Neg Hx   . Bladder Cancer Neg Hx   . Kidney cancer Neg Hx     Social History:  reports that he has been smoking.  he has never used smokeless tobacco. He reports that he does not drink alcohol or use drugs.  ROS: UROLOGY Frequent Urination?: No Hard to postpone urination?: No Burning/pain with urination?: No Get up at night to urinate?: No Leakage of urine?: No Urine stream starts and stops?: No Trouble starting stream?: No Do you have to strain to urinate?: No Blood in urine?: No Urinary tract infection?: No Sexually transmitted disease?: No Injury to kidneys or bladder?: No Painful intercourse?: No Weak stream?: No Erection problems?: No Penile pain?: No  Gastrointestinal Nausea?: No Vomiting?: No Indigestion/heartburn?: No Diarrhea?: No Constipation?: No  Constitutional Fever: No Night sweats?: No Weight loss?: No Fatigue?: No  Skin Skin rash/lesions?: No Itching?: No  Eyes Blurred vision?: No Double vision?: No  Ears/Nose/Throat Sore throat?: No Sinus problems?: No  Hematologic/Lymphatic Swollen glands?: No Easy bruising?: No  Cardiovascular Leg swelling?: No Chest pain?: No  Respiratory Cough?: No Shortness of breath?: No  Endocrine Excessive thirst?: No  Musculoskeletal Back pain?: No Joint pain?: No  Neurological Headaches?: No Dizziness?: No  Psychologic Depression?: No  Anxiety?: No  Physical Exam: BP 125/81 (BP Location: Right Arm, Patient Position: Sitting, Cuff Size: Large)   Pulse 73   Ht 5\' 11"  (1.803 m)   Wt 241 lb 1.6 oz (109.4 kg)   BMI 33.63 kg/m   Constitutional:  Alert and oriented, No acute distress. HEENT: [place] AT, moist mucus membranes.  Trachea midline, no masses. Cardiovascular: No clubbing, cyanosis, or edema. Respiratory: Normal  respiratory effort, no increased work of breathing. GI: Abdomen is soft, nontender, nondistended, no abdominal masses GU: No CVA tenderness.  Skin: No rashes, bruises or suspicious lesions. Lymph: No cervical or inguinal adenopathy. Neurologic: Grossly intact, no focal deficits, moving all 4 extremities. Psychiatric: Normal mood and affect.  Laboratory Data: Lab Results  Component Value Date   WBC 9.3 08/17/2016   HGB 15.7 08/17/2016   HCT 43.7 08/17/2016   MCV 87 08/17/2016   PLT 214 03/23/2015    Lab Results  Component Value Date   CREATININE 1.14 03/23/2015    No results found for: PSA  Lab Results  Component Value Date   TESTOSTERONE 196 (L) 08/17/2016    No results found for: HGBA1C  Urinalysis    Component Value Date/Time   COLORURINE Yellow 05/09/2011 1750   APPEARANCEUR Clear 08/04/2016 1321   LABSPEC 1.013 05/09/2011 1750   PHURINE 7.0 05/09/2011 1750   GLUCOSEU Negative 08/04/2016 1321   GLUCOSEU Negative 05/09/2011 1750   HGBUR Negative 05/09/2011 1750   BILIRUBINUR Negative 08/04/2016 1321   BILIRUBINUR Negative 05/09/2011 1750   KETONESUR Negative 05/09/2011 1750   PROTEINUR Negative 08/04/2016 1321   PROTEINUR Negative 05/09/2011 1750   NITRITE Negative 08/04/2016 1321   NITRITE Negative 05/09/2011 1750   LEUKOCYTESUR Negative 08/04/2016 1321   LEUKOCYTESUR Trace 05/09/2011 1750    Assessment & Plan:    1.  Hypogonadism I discussed with the patient that testosterone replacement therapy takes compliance and appropriate follow-up.  We did discuss that this medication is not one to start and stop.  We also did discuss that it is not appropriate to miss his appointments to check labs.  We will have him follow-up for an a.m. lab draw since his labs are 98 months old.  If this confirms continued low testosterone, we will resume his testosterone replacement therapy.  He will need to receive his injections in the office due to his noncompliance.  I did  discuss with him that if he is noncompliant again that we will no longer be able to provide him with testosterone in this office.  Return for labs before 9:30 AM.  Nickie Retort, MD  [facility] [address] [place], [place] [address] [phone]

## 2017-02-08 ENCOUNTER — Other Ambulatory Visit: Payer: BLUE CROSS/BLUE SHIELD

## 2017-02-08 DIAGNOSIS — E291 Testicular hypofunction: Secondary | ICD-10-CM

## 2017-02-09 LAB — CBC WITH DIFFERENTIAL
BASOS: 0 %
Basophils Absolute: 0 10*3/uL (ref 0.0–0.2)
EOS (ABSOLUTE): 0.1 10*3/uL (ref 0.0–0.4)
EOS: 2 %
Hematocrit: 42.9 % (ref 37.5–51.0)
Hemoglobin: 14.4 g/dL (ref 13.0–17.7)
IMMATURE GRANS (ABS): 0 10*3/uL (ref 0.0–0.1)
Immature Granulocytes: 0 %
LYMPHS: 33 %
Lymphocytes Absolute: 2.2 10*3/uL (ref 0.7–3.1)
MCH: 30.4 pg (ref 26.6–33.0)
MCHC: 33.6 g/dL (ref 31.5–35.7)
MCV: 91 fL (ref 79–97)
Monocytes Absolute: 0.6 10*3/uL (ref 0.1–0.9)
Monocytes: 8 %
NEUTROS ABS: 3.9 10*3/uL (ref 1.4–7.0)
NEUTROS PCT: 57 %
RBC: 4.73 x10E6/uL (ref 4.14–5.80)
RDW: 13.9 % (ref 12.3–15.4)
WBC: 6.9 10*3/uL (ref 3.4–10.8)

## 2017-02-09 LAB — PSA: Prostate Specific Ag, Serum: 0.2 ng/mL (ref 0.0–4.0)

## 2017-02-09 LAB — HEPATIC FUNCTION PANEL
ALBUMIN: 4.2 g/dL (ref 3.5–5.5)
ALK PHOS: 123 IU/L — AB (ref 39–117)
ALT: 18 IU/L (ref 0–44)
AST: 15 IU/L (ref 0–40)
Bilirubin Total: 0.2 mg/dL (ref 0.0–1.2)
Bilirubin, Direct: 0.08 mg/dL (ref 0.00–0.40)
TOTAL PROTEIN: 6.5 g/dL (ref 6.0–8.5)

## 2017-02-09 LAB — TESTOSTERONE: Testosterone: 144 ng/dL — ABNORMAL LOW (ref 264–916)

## 2017-02-11 ENCOUNTER — Telehealth: Payer: Self-pay | Admitting: Urology

## 2017-02-11 NOTE — Telephone Encounter (Signed)
Per your approval I went over his labs with him on the phone and scheduled  him for testosterone training with [facility].  Thanks, Sharyn Lull

## 2017-02-14 ENCOUNTER — Ambulatory Visit (INDEPENDENT_AMBULATORY_CARE_PROVIDER_SITE_OTHER): Payer: BLUE CROSS/BLUE SHIELD | Admitting: Family Medicine

## 2017-02-14 DIAGNOSIS — E291 Testicular hypofunction: Secondary | ICD-10-CM

## 2017-02-14 MED ORDER — TESTOSTERONE CYPIONATE 200 MG/ML IM SOLN
200.0000 mg | Freq: Once | INTRAMUSCULAR | Status: AC
  Administered 2017-02-14: 200 mg via INTRAMUSCULAR

## 2017-02-14 NOTE — Progress Notes (Signed)
Testosterone IM Injection  Due to Hypogonadism patient is present today for a Testosterone Injection.  Medication: Testosterone Cypionate Dose: 1.32ml Location: left upper outer buttocks Lot: [id number] Exp:4/20  Patient tolerated well, no complications were noted  Preformed by: Elberta Leatherwood, CMA  Follow up: 2 weeks

## 2017-02-28 ENCOUNTER — Ambulatory Visit (INDEPENDENT_AMBULATORY_CARE_PROVIDER_SITE_OTHER): Payer: BLUE CROSS/BLUE SHIELD

## 2017-02-28 DIAGNOSIS — E291 Testicular hypofunction: Secondary | ICD-10-CM | POA: Diagnosis not present

## 2017-02-28 MED ORDER — TESTOSTERONE CYPIONATE 200 MG/ML IM SOLN
200.0000 mg | Freq: Once | INTRAMUSCULAR | Status: AC
  Administered 2017-02-28: 200 mg via INTRAMUSCULAR

## 2017-02-28 NOTE — Progress Notes (Signed)
Testosterone IM Injection  Due to Hypogonadism patient is present today for a Testosterone Injection.  Medication: Testosterone Cypionate Dose: 85mL Location: right upper outer buttocks Lot: [id number] Exp:03/2018  Patient tolerated well, no complications were noted  Preformed by: Toniann Fail, LPN   Follow up: 2 weeks

## 2017-03-14 ENCOUNTER — Ambulatory Visit: Payer: BLUE CROSS/BLUE SHIELD

## 2017-03-15 ENCOUNTER — Telehealth: Payer: Self-pay | Admitting: Urology

## 2017-03-15 ENCOUNTER — Ambulatory Visit: Payer: BLUE CROSS/BLUE SHIELD

## 2017-03-15 NOTE — Telephone Encounter (Signed)
Patient needs a refill on his testosterone medication he was coming in for an injection today but I cancelled it until he can get more medication.  Sharyn Lull

## 2017-03-17 ENCOUNTER — Other Ambulatory Visit: Payer: Self-pay

## 2017-03-17 MED ORDER — TESTOSTERONE CYPIONATE 200 MG/ML IM SOLN: 200.0000 mg | INTRAMUSCULAR | 0 refills | Status: DC

## 2017-03-17 NOTE — Telephone Encounter (Signed)
Medication refilled

## 2017-03-18 ENCOUNTER — Ambulatory Visit (INDEPENDENT_AMBULATORY_CARE_PROVIDER_SITE_OTHER): Payer: BLUE CROSS/BLUE SHIELD | Admitting: Family Medicine

## 2017-03-18 DIAGNOSIS — E291 Testicular hypofunction: Secondary | ICD-10-CM

## 2017-03-18 MED ORDER — TESTOSTERONE CYPIONATE 200 MG/ML IM SOLN
200.0000 mg | Freq: Once | INTRAMUSCULAR | Status: AC
  Administered 2017-03-18: 200 mg via INTRAMUSCULAR

## 2017-03-18 NOTE — Progress Notes (Signed)
Testosterone IM Injection  Due to Hypogonadism patient is present today for a Testosterone Injection.  Medication: Testosterone Cypionate Dose: 51ml Location: left upper outer buttocks Lot: [id number] Exp:06/2018  Patient tolerated well, no complications were noted  Preformed by: Elberta Leatherwood, CMA  Follow up: 2 weeks

## 2017-04-04 ENCOUNTER — Ambulatory Visit: Payer: BLUE CROSS/BLUE SHIELD

## 2017-04-08 ENCOUNTER — Ambulatory Visit (INDEPENDENT_AMBULATORY_CARE_PROVIDER_SITE_OTHER): Payer: BLUE CROSS/BLUE SHIELD

## 2017-04-08 DIAGNOSIS — E291 Testicular hypofunction: Secondary | ICD-10-CM

## 2017-04-08 MED ORDER — TESTOSTERONE CYPIONATE 200 MG/ML IM SOLN
200.0000 mg | Freq: Once | INTRAMUSCULAR | Status: AC
  Administered 2017-04-08: 200 mg via INTRAMUSCULAR

## 2017-04-08 NOTE — Progress Notes (Signed)
Testosterone IM Injection  Due to Hypogonadism patient is present today for a Testosterone Injection.  Medication: Testosterone Cypionate Dose: 16mL Location: right upper outer buttocks Lot: [id number] Exp:06/2018  Patient tolerated well, no complications were noted  Preformed by: Toniann Fail, LPN   Follow up: 2 weeks

## 2017-04-20 IMAGING — CR DG CHEST 2V
2 series · 2 of 2 positions shown · non-contrast
Comparison: None.

CLINICAL DATA: Chest tightness.  Pain and pressure in left arm.

EXAM:
CHEST  2 VIEW

[chest pa]
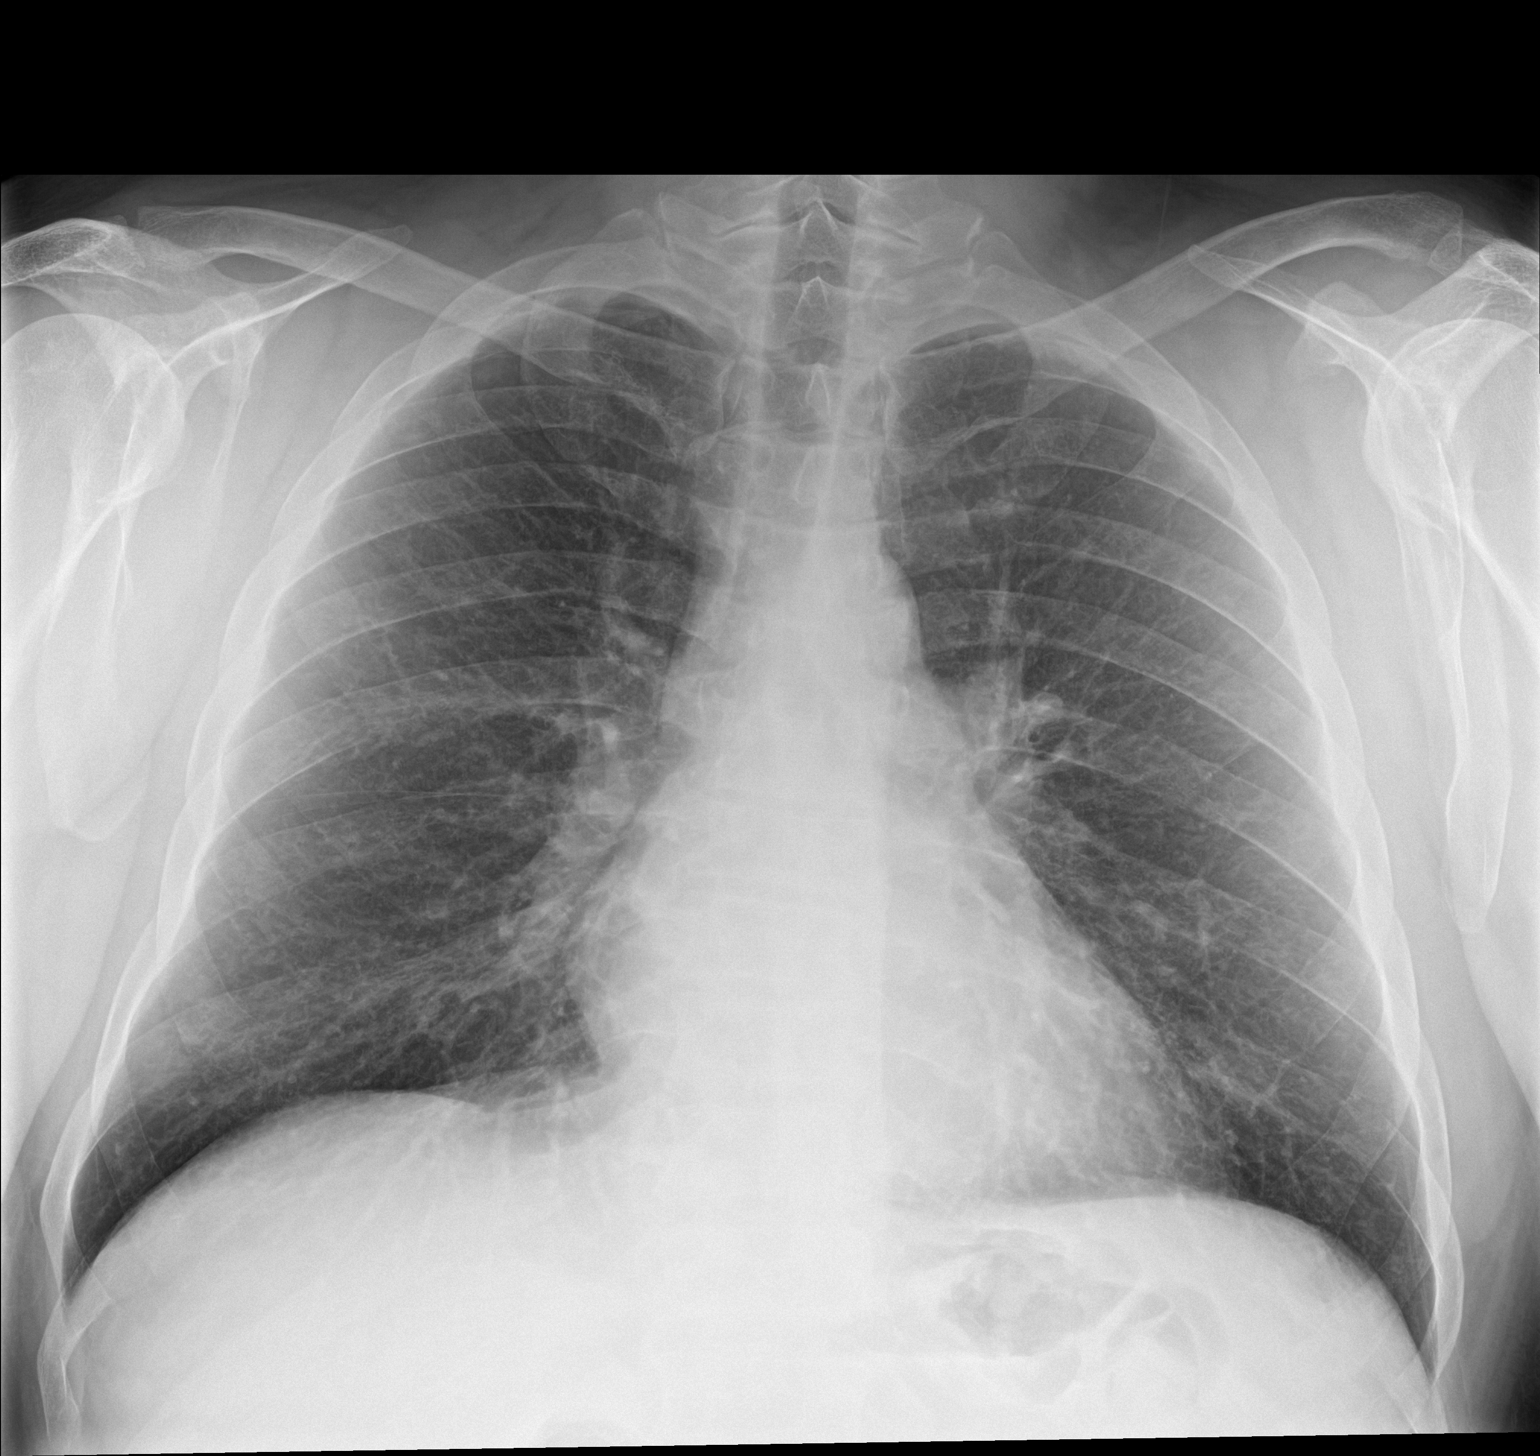

[chest lat]
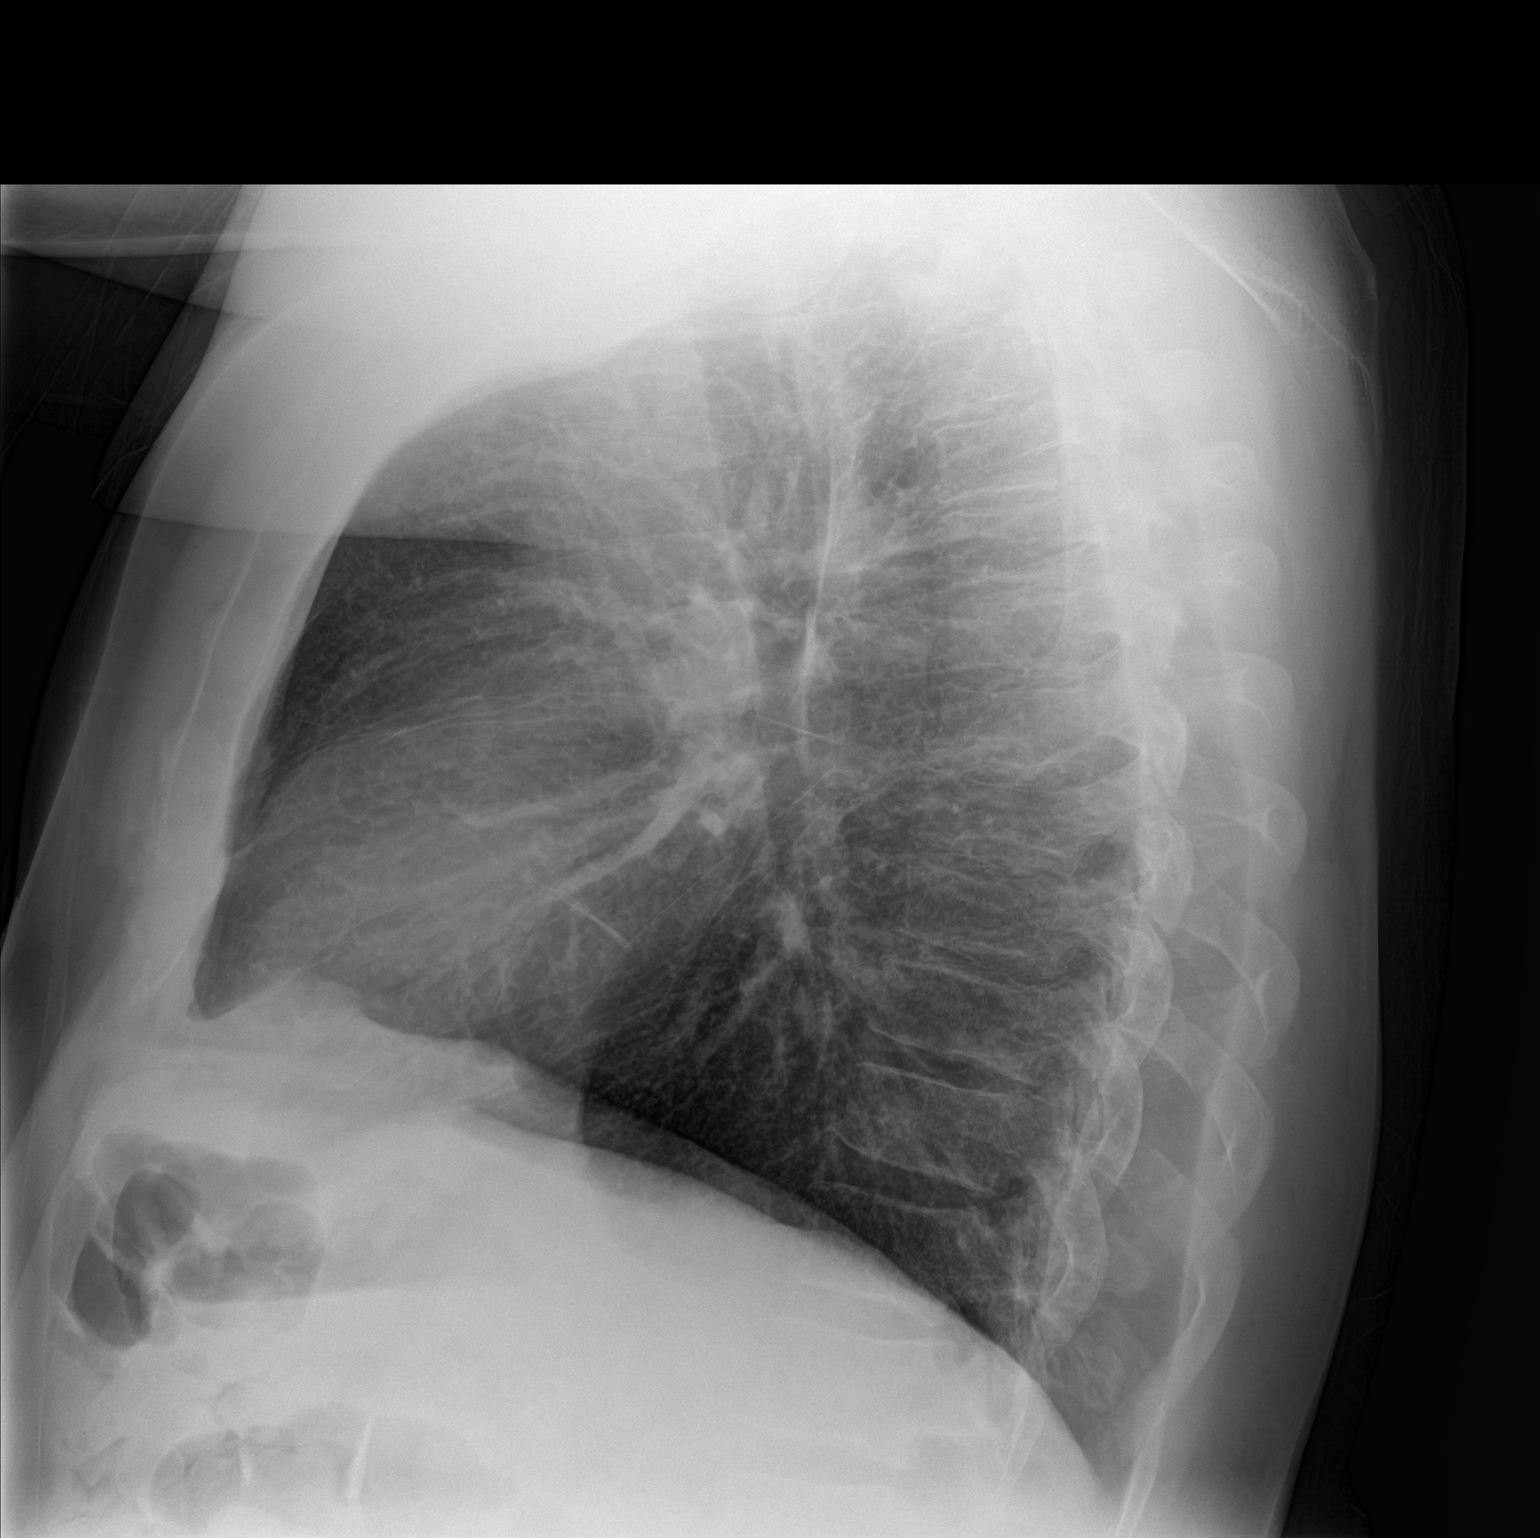

[2 of 2 positions shown; findings below may reference images not displayed]

FINDINGS: Heart and mediastinal contours are within normal limits. No focal
opacities or effusions. No acute bony abnormality.
IMPRESSION: No active cardiopulmonary disease.

## 2017-04-22 ENCOUNTER — Ambulatory Visit (INDEPENDENT_AMBULATORY_CARE_PROVIDER_SITE_OTHER): Payer: BLUE CROSS/BLUE SHIELD

## 2017-04-22 DIAGNOSIS — E291 Testicular hypofunction: Secondary | ICD-10-CM | POA: Diagnosis not present

## 2017-04-22 MED ORDER — TESTOSTERONE CYPIONATE 200 MG/ML IM SOLN
200.0000 mg | Freq: Once | INTRAMUSCULAR | Status: AC
  Administered 2017-04-22: 200 mg via INTRAMUSCULAR

## 2017-04-22 NOTE — Progress Notes (Signed)
Testosterone IM Injection  Due to Hypogonadism patient is present today for a Testosterone Injection.  Medication: Testosterone Cypionate Dose: 18mL Location: left upper outer buttocks Lot: [id number] Exp:06/2018  Patient tolerated well, no complications were noted  Preformed by: Toniann Fail, LPN   Follow up: 2 weeks

## 2017-05-09 ENCOUNTER — Ambulatory Visit: Payer: BLUE CROSS/BLUE SHIELD

## 2017-05-09 ENCOUNTER — Encounter: Payer: Self-pay | Admitting: Urology

## 2017-05-12 ENCOUNTER — Ambulatory Visit (INDEPENDENT_AMBULATORY_CARE_PROVIDER_SITE_OTHER): Payer: BLUE CROSS/BLUE SHIELD

## 2017-05-12 DIAGNOSIS — E291 Testicular hypofunction: Secondary | ICD-10-CM

## 2017-05-12 LAB — BLADDER SCAN AMB NON-IMAGING: Scan Result: 160

## 2017-05-12 MED ORDER — TESTOSTERONE CYPIONATE 200 MG/ML IM SOLN
200.0000 mg | Freq: Once | INTRAMUSCULAR | Status: AC
  Administered 2017-05-12: 200 mg via INTRAMUSCULAR

## 2017-05-12 NOTE — Progress Notes (Signed)
Testosterone IM Injection  Due to Hypogonadism patient is present today for a Testosterone Injection.  Medication: Testosterone Cypionate Dose: 84mL Location: right upper outer buttocks Lot: [id number] Exp:07/2018  Patient tolerated well, no complications were noted  Preformed by: Toniann Fail, LPN   Follow up: 2 weeks

## 2017-05-30 ENCOUNTER — Ambulatory Visit (INDEPENDENT_AMBULATORY_CARE_PROVIDER_SITE_OTHER): Payer: BLUE CROSS/BLUE SHIELD

## 2017-05-30 DIAGNOSIS — E291 Testicular hypofunction: Secondary | ICD-10-CM | POA: Diagnosis not present

## 2017-05-30 MED ORDER — TESTOSTERONE CYPIONATE 200 MG/ML IM SOLN
200.0000 mg | Freq: Once | INTRAMUSCULAR | Status: AC
  Administered 2017-05-30: 200 mg via INTRAMUSCULAR

## 2017-06-13 ENCOUNTER — Ambulatory Visit (INDEPENDENT_AMBULATORY_CARE_PROVIDER_SITE_OTHER): Payer: BLUE CROSS/BLUE SHIELD

## 2017-06-13 DIAGNOSIS — E291 Testicular hypofunction: Secondary | ICD-10-CM

## 2017-06-13 MED ORDER — TESTOSTERONE CYPIONATE 200 MG/ML IM SOLN
200.0000 mg | Freq: Once | INTRAMUSCULAR | Status: AC
  Administered 2017-06-13: 200 mg via INTRAMUSCULAR

## 2017-06-13 NOTE — Progress Notes (Signed)
Testosterone IM Injection  Due to Hypogonadism patient is present today for a Testosterone Injection.  Medication: Testosterone Cypionate Dose: 58ml Location: right upper outer buttocks Lot: [id number] Exp:06/2018  Patient tolerated well, no complications were noted  Preformed by: Fonnie Jarvis, CMA  Follow up: Patient to follow up in two weeks for next injection , patient was informed that today his last vial of medication was used and he would need to get a refill for his next injection. Patient verbalized understanding

## 2017-06-17 ENCOUNTER — Other Ambulatory Visit: Payer: Self-pay

## 2017-06-17 ENCOUNTER — Encounter: Payer: Self-pay | Admitting: Emergency Medicine

## 2017-06-17 ENCOUNTER — Emergency Department
Admission: EM | Admit: 2017-06-17 | Discharge: 2017-06-17 | Disposition: A | Discharge status: Home / Self Care | Payer: BLUE CROSS/BLUE SHIELD | Attending: Emergency Medicine | Admitting: Emergency Medicine

## 2017-06-17 DIAGNOSIS — Z79899 Other long term (current) drug therapy: Secondary | ICD-10-CM | POA: Insufficient documentation

## 2017-06-17 DIAGNOSIS — J0100 Acute maxillary sinusitis, unspecified: Secondary | ICD-10-CM | POA: Insufficient documentation

## 2017-06-17 DIAGNOSIS — R21 Rash and other nonspecific skin eruption: Secondary | ICD-10-CM | POA: Diagnosis present

## 2017-06-17 DIAGNOSIS — F17210 Nicotine dependence, cigarettes, uncomplicated: Secondary | ICD-10-CM | POA: Diagnosis not present

## 2017-06-17 DIAGNOSIS — L259 Unspecified contact dermatitis, unspecified cause: Secondary | ICD-10-CM | POA: Insufficient documentation

## 2017-06-17 LAB — GROUP A STREP BY PCR: Group A Strep by PCR: NOT DETECTED

## 2017-06-17 MED ORDER — DIPHENHYDRAMINE HCL 25 MG PO CAPS: 25.0000 mg | ORAL_CAPSULE | ORAL | 0 refills | Status: DC | PRN

## 2017-06-17 MED ORDER — PREDNISONE 20 MG PO TABS: ORAL_TABLET | ORAL | 0 refills | Status: AC

## 2017-06-17 MED ORDER — AMOXICILLIN-POT CLAVULANATE 875-125 MG PO TABS: 1.0000 | ORAL_TABLET | Freq: Two times a day (BID) | ORAL | 0 refills | Status: AC

## 2017-06-17 MED ORDER — METHYLPREDNISOLONE SODIUM SUCC 125 MG IJ SOLR
125.0000 mg | Freq: Once | INTRAMUSCULAR | Status: AC
  Administered 2017-06-17: 125 mg via INTRAMUSCULAR
  Filled 2017-06-17: qty 2

## 2017-06-17 NOTE — ED Provider Notes (Signed)
[facility] Emergency Department Provider Note  ____________________________________________  Time seen: Approximately 2:54 PM  I have reviewed the triage vital signs and the nursing notes.   HISTORY  Chief Complaint Rash    HPI Dustin Webb is a 42 y.o. male that presents emergency department for evaluation of rash to bilateral arms and chest for 2 days and chills, nasal congestion, and sore throat for 1 day.  Rash itches.  He started Terbinafine just over 2 weeks ago.  No new washes, detergents, lotions.  He has not had a specific poison ivy exposure but has dog bit outside.  No insect or tick bites. He has been applying calamine lotion to rash.  No nausea, vomiting, abdominal pain.  History reviewed. No pertinent past medical history.  There are no active problems to display for this patient.   Past Surgical History:  Procedure Laterality Date  . CATARACT EXTRACTION Right 1976    Prior to Admission medications   Medication Sig Start Date End Date Taking? Authorizing Provider  amoxicillin-clavulanate (AUGMENTIN) 875-125 MG tablet Take 1 tablet by mouth 2 (two) times daily for 10 days. 06/17/17 06/27/17  Laban Emperor, PA-C  diphenhydrAMINE (BENADRYL) 25 mg capsule Take 1 capsule (25 mg total) by mouth every 4 (four) hours as needed. 06/17/17 06/17/18  Laban Emperor, PA-C  methadone (DOLOPHINE) 10 MG/5ML solution Take 115 mg by mouth daily.     [provider]  predniSONE (DELTASONE) 20 MG tablet Take 3 tables for day 1-5. Take 2 tablets day 6-10. Take 1 tablet day 11-15 06/17/17   Laban Emperor, PA-C  testosterone cypionate (DEPOTESTOSTERONE CYPIONATE) 200 MG/ML injection Inject 1 mL (200 mg total) into the muscle every 14 (fourteen) days. 03/17/17   Nickie Retort, MD    Allergies Patient has no known allergies.  Family History  Problem Relation Age of Onset  . Prostate cancer Neg Hx   . Bladder Cancer Neg Hx   . Kidney cancer Neg  Hx     Social History Social History   Tobacco Use  . Smoking status: Current Every Day Smoker  . Smokeless tobacco: Never Used  Substance Use Topics  . Alcohol use: No  . Drug use: No     Review of Systems   Eyes: No visual changes. No discharge. ENT: Positive for congestion and rhinorrhea. Cardiovascular: No chest pain. Respiratory: Negative for cough. No SOB. Gastrointestinal: No abdominal pain.  No nausea, no vomiting.  No diarrhea.  No constipation. Skin: Negative for abrasions, lacerations, ecchymosis. Positive for rash. Neurological: Negative for headaches.   ____________________________________________   PHYSICAL EXAM:  VITAL SIGNS: ED Triage Vitals  Enc Vitals Group     BP 06/17/17 1219 (!) 157/88     Pulse Rate 06/17/17 1219 95     Resp 06/17/17 1219 16     Temp 06/17/17 1219 99.6 F (37.6 C)     Temp Source 06/17/17 1219 Oral     SpO2 06/17/17 1219 98 %     Weight 06/17/17 1220 241 lb (109.3 kg)     Height --      Head Circumference --      Peak Flow --      Pain Score 06/17/17 1220 0     Pain Loc --      Pain Edu? --      Excl. in [place]? --      Constitutional: Alert and oriented. Well appearing and in no acute distress. Eyes: Conjunctivae are normal.  PERRL. EOMI. No discharge. Head: Atraumatic. ENT: No frontal and maxillary sinus tenderness.      Ears: Tympanic membranes pearly gray with good landmarks. No discharge.      Nose: Mild congestion/rhinnorhea.      Mouth/Throat: Mucous membranes are moist. Oropharynx erythematous. Tonsils not enlarged. No exudates. Uvula midline. Neck: No stridor.   Hematological/Lymphatic/Immunilogical: No cervical lymphadenopathy. Cardiovascular: Normal rate, regular rhythm.  Good peripheral circulation. Respiratory: Normal respiratory effort without tachypnea or retractions. Lungs CTAB. Good air entry to the bases with no decreased or absent breath sounds. Gastrointestinal: Bowel sounds 4 quadrants. Soft and  nontender to palpation. No guarding or rigidity. No palpable masses. No distention. Musculoskeletal: Full range of motion to all extremities. No gross deformities appreciated. Neurologic:  Normal speech and language. No gross focal neurologic deficits are appreciated.  Skin:  Skin is warm, dry and intact. Vesicles to right hand. Pink papules to bilateral arms, trunk, abdomen. Psychiatric: Mood and affect are normal. Speech and behavior are normal. Patient exhibits appropriate insight and judgement.   ____________________________________________   LABS (all labs ordered are listed, but only abnormal results are displayed)  Labs Reviewed  GROUP A STREP BY PCR   ____________________________________________  EKG   ____________________________________________  RADIOLOGY  No results found.  ____________________________________________    PROCEDURES  Procedure(s) performed:    Procedures    Medications  methylPREDNISolone sodium succinate (SOLU-MEDROL) 125 mg/2 mL injection 125 mg (125 mg Intramuscular Given 06/17/17 1319)     ____________________________________________   INITIAL IMPRESSION / ASSESSMENT AND PLAN / ED COURSE  Pertinent labs & imaging results that were available during my care of the patient were reviewed by me and considered in my medical decision making (see chart for details).  Review of the [place] CSRS was performed in accordance of the [place] prior to dispensing any controlled drugs.     Patient's diagnosis is consistent with sinusitis and contact dermatitis. Vital signs and exam are reassuring.  Strep negative.  Patient started new medication 2 weeks ago and will discontinue medication. Rash has appearance consistent with contact dermatitis.  Patient feels comfortable going home. Patient will be discharged home with prescriptions for Augmentin, prednisone, Benadryl. Patient is to follow up with PCP as needed or otherwise directed. Patient is given ED  precautions to return to the ED for any worsening or new symptoms.     ____________________________________________  FINAL CLINICAL IMPRESSION(S) / ED DIAGNOSES  Final diagnoses:  Acute non-recurrent maxillary sinusitis      NEW MEDICATIONS STARTED DURING THIS VISIT:  ED Discharge Orders        Ordered    amoxicillin-clavulanate (AUGMENTIN) 875-125 MG tablet  2 times daily     06/17/17 1446    predniSONE (DELTASONE) 20 MG tablet     06/17/17 1446    diphenhydrAMINE (BENADRYL) 25 mg capsule  Every 4 hours PRN     06/17/17 1446          This chart was dictated using voice recognition software/Dragon. Despite best efforts to proofread, errors can occur which can change the meaning. Any change was purely unintentional.    Laban Emperor, PA-C 06/17/17 1650    Nena Polio, MD 06/18/17 1540

## 2017-06-17 NOTE — ED Triage Notes (Signed)
Pt to ED via POV. Pt states that he has rash x 3 days. Pt states that he has also had fever chills, and body aches. Pt is in NAD at this time.

## 2017-06-17 NOTE — ED Notes (Signed)
See triage note.  States he developed rash to arms,legs and chest about 3 days ago  Positive itching   Then states he had subjective fever and body aches and sore throat   Low grade fever on arrival

## 2017-06-27 ENCOUNTER — Ambulatory Visit (INDEPENDENT_AMBULATORY_CARE_PROVIDER_SITE_OTHER): Payer: BLUE CROSS/BLUE SHIELD

## 2017-06-27 DIAGNOSIS — E291 Testicular hypofunction: Secondary | ICD-10-CM | POA: Diagnosis not present

## 2017-06-27 MED ORDER — TESTOSTERONE CYPIONATE 200 MG/ML IM SOLN
200.0000 mg | Freq: Once | INTRAMUSCULAR | Status: AC
  Administered 2017-06-27: 200 mg via INTRAMUSCULAR

## 2017-06-27 NOTE — Progress Notes (Signed)
Testosterone IM Injection  Due to Hypogonadism patient is present today for a Testosterone Injection.  Medication: Testosterone Cypionate Dose: 31ml Location: left upper outer buttocks Lot: [id number] Exp:01/2019  Patient tolerated well, no complications were noted  Preformed by: Cristie Hem, CMA  Follow up: As scheduled

## 2017-07-11 ENCOUNTER — Encounter: Payer: Self-pay | Admitting: Urology

## 2017-07-11 ENCOUNTER — Ambulatory Visit: Payer: BLUE CROSS/BLUE SHIELD

## 2017-07-18 ENCOUNTER — Ambulatory Visit (INDEPENDENT_AMBULATORY_CARE_PROVIDER_SITE_OTHER): Payer: BLUE CROSS/BLUE SHIELD

## 2017-07-18 DIAGNOSIS — E291 Testicular hypofunction: Secondary | ICD-10-CM

## 2017-07-18 MED ORDER — TESTOSTERONE CYPIONATE 200 MG/ML IM SOLN
200.0000 mg | Freq: Once | INTRAMUSCULAR | Status: AC
  Administered 2017-07-18: 200 mg via INTRAMUSCULAR

## 2017-07-18 NOTE — Progress Notes (Signed)
Testosterone IM Injection  Due to Hypogonadism patient is present today for a Testosterone Injection.  Medication: Testosterone Cypionate Dose: 44ml Location: right upper outer buttocks Lot: [id number] Exp:03/2019  Patient tolerated well, no complications were noted  Preformed by: Fonnie Jarvis, CMA  Follow up: 2wk

## 2017-08-01 ENCOUNTER — Ambulatory Visit: Payer: BLUE CROSS/BLUE SHIELD

## 2017-08-01 ENCOUNTER — Encounter: Payer: Self-pay | Admitting: Urology

## 2017-08-15 ENCOUNTER — Encounter: Payer: Self-pay | Admitting: Urology

## 2017-08-15 ENCOUNTER — Ambulatory Visit: Payer: Self-pay

## 2017-08-15 ENCOUNTER — Other Ambulatory Visit: Payer: Self-pay

## 2017-08-15 DIAGNOSIS — E291 Testicular hypofunction: Secondary | ICD-10-CM

## 2017-08-15 NOTE — Progress Notes (Signed)
Testosterone IM Injection  Due to Hypogonadism patient is present today for a Testosterone Injection.  Medication: Testosterone Cypionate Dose: 63ml Location: right upper outer buttocks Lot: [id number] Exp: 03/2019  Patient tolerated well, no complications were noted  Preformed by: Cristie Hem, CMA  Follow up: As scheudled

## 2017-08-28 NOTE — Progress Notes (Deleted)
08/30/2017 7:28 PM   Dustin Webb [birth year] [id number]  Referring provider: Center, [facility] [place], [place] [address]  No chief complaint on file.   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  Testosterone deficiency He is still having/no longer having spontaneous erections at night. ***  He has sleep apnea and is sleeping with a CPAP machine.  He has sleep apnea and is not sleeping with a CPAP machine.  He does not have sleep apnea. ***.   His current testosterone level was 144 in 01/2017.  He is currently managing his testosterone deficiency with testosterone cypionate injections.     PMH: No past medical history on file.  Surgical History: Past Surgical History:  Procedure Laterality Date  . CATARACT EXTRACTION Right 1976    Home Medications:  Allergies as of 08/30/2017   No Known Allergies     Medication List        Accurate as of 08/28/17  7:28 PM. Always use your most recent med list.          diphenhydrAMINE 25 mg capsule Commonly known as:  BENADRYL Take 1 capsule (25 mg total) by mouth every 4 (four) hours as needed.   methadone 10 MG/5ML solution Commonly known as:  DOLOPHINE Take 115 mg by mouth daily.   predniSONE 20 MG tablet Commonly known as:  DELTASONE Take 3 tables for day 1-5. Take 2 tablets day 6-10. Take 1 tablet day 11-15   terbinafine 250 MG tablet Commonly known as:  LAMISIL   testosterone cypionate 200 MG/ML injection Commonly known as:  DEPOTESTOSTERONE CYPIONATE Inject 1 mL (200 mg total) into the muscle every 14 (fourteen) days.       Allergies: No Known Allergies  Family History: Family History  Problem Relation Age of Onset  . Prostate cancer Neg Hx   . Bladder Cancer Neg Hx   . Kidney cancer Neg Hx     Social History:  reports that he has been smoking.  He has never used smokeless tobacco. He  reports that he does not drink alcohol or use drugs.  ROS:                                        Physical Exam: There were no vitals taken for this visit.  Constitutional: Well nourished. Alert and oriented, No acute distress. HEENT: [place] AT, moist mucus membranes. Trachea midline, no masses. Cardiovascular: No clubbing, cyanosis, or edema. Respiratory: Normal respiratory effort, no increased work of breathing. GI: Abdomen is soft, non tender, non distended, no abdominal masses. Liver and spleen not palpable.  No hernias appreciated.  Stool sample for occult testing is not indicated.   GU: No CVA tenderness.  No bladder fullness or masses.  Patient with circumcised/uncircumcised phallus. ***Foreskin easily retracted***  Urethral meatus is patent.  No penile discharge. No penile lesions or rashes. Scrotum without lesions, cysts, rashes and/or edema.  Testicles are located scrotally bilaterally. No masses are appreciated in the testicles. Left and right epididymis are normal. Rectal: Patient with  normal sphincter tone. Anus and perineum without scarring or rashes. No rectal masses are appreciated. Prostate is approximately *** grams, *** nodules are appreciated. Seminal vesicles are normal. Skin: No rashes, bruises or suspicious lesions. Lymph: No cervical or inguinal adenopathy. Neurologic: Grossly  intact, no focal deficits, moving all 4 extremities. Psychiatric: Normal mood and affect.   Laboratory Data: Lab Results  Component Value Date   WBC 6.9 02/08/2017   HGB 14.4 02/08/2017   HCT 42.9 02/08/2017   MCV 91 02/08/2017   PLT 214 03/23/2015    Lab Results  Component Value Date   CREATININE 1.14 03/23/2015    No results found for: PSA  Lab Results  Component Value Date   TESTOSTERONE 144 (L) 02/08/2017    No results found for: HGBA1C  Urinalysis    Component Value Date/Time   COLORURINE Yellow 05/09/2011 1750   APPEARANCEUR Clear 08/04/2016  1321   LABSPEC 1.013 05/09/2011 1750   PHURINE 7.0 05/09/2011 1750   GLUCOSEU Negative 08/04/2016 1321   GLUCOSEU Negative 05/09/2011 1750   HGBUR Negative 05/09/2011 1750   BILIRUBINUR Negative 08/04/2016 1321   BILIRUBINUR Negative 05/09/2011 1750   KETONESUR Negative 05/09/2011 1750   PROTEINUR Negative 08/04/2016 1321   PROTEINUR Negative 05/09/2011 1750   NITRITE Negative 08/04/2016 1321   NITRITE Negative 05/09/2011 1750   LEUKOCYTESUR Negative 08/04/2016 1321   LEUKOCYTESUR Trace 05/09/2011 1750    Assessment & Plan:    1. Testosterone deficiency Most recent testosterone level is 144 ng/dL in 01/2017 (goal 450-600 ng/dL) *** -continue Clomid 50 mg, 1/2 tablet daily; Clomid 50 mg, 1 tablet daily; Testopel insertion; AndroGel 1.62%, *** pumps daily; Testim 5 mg, *** tube(s) daily; Fortesta, *** pumps to each thigh daily; Androderm, *** patch daily  -RTC in 6 months for HCT/HBG, testosterone, LFT's, ADAM and exam    No follow-ups on file.  Zara Council, PA-C  [facility] [address] [place], [place] [address] [phone]

## 2017-08-29 ENCOUNTER — Telehealth: Payer: Self-pay | Admitting: Urology

## 2017-08-29 NOTE — Telephone Encounter (Signed)
Pt asking if he can still get testosterone refill even tho he had to reschedule appt due to no insurance, Please advise pt at [phone]

## 2017-08-30 ENCOUNTER — Ambulatory Visit: Payer: Self-pay | Admitting: Urology

## 2017-08-31 MED ORDER — TESTOSTERONE CYPIONATE 200 MG/ML IM SOLN: 200.0000 mg | INTRAMUSCULAR | 0 refills | Status: DC

## 2017-08-31 NOTE — Addendum Note (Signed)
Addended by: John Giovanni C on: 08/31/2017 08:00 AM   Modules accepted: Orders

## 2017-08-31 NOTE — Telephone Encounter (Signed)
Limited Rx was sent-enough to last until his next appointment.

## 2017-10-03 ENCOUNTER — Ambulatory Visit: Payer: Self-pay | Admitting: Urology

## 2017-10-03 ENCOUNTER — Encounter: Payer: Self-pay | Admitting: Urology

## 2017-10-03 NOTE — Progress Notes (Deleted)
10/03/2017 5:56 AM   Dustin Webb 02-21-1975 [id number]  Referring provider: Center, [facility] [place], [place] [address]  No chief complaint on file.   HPI: Patient is a 42 year old African-American male with a testosterone deficiency who presents today for follow-up.  Testosterone dificiency He has no children. He is not currently interested in having a child at any point.   Treating testosterone deficiency with testosterone cypionate.  BPH WITH LUTS  (prostate and/or bladder) IPSS score: *** PVR: ***   Previous score: ***   Previous PVR: ***   Major complaint(s):  x *** years. Denies any dysuria, hematuria or suprapubic pain.   Currently taking: ***.  His has had ***.   Denies any recent fevers, chills, nausea or vomiting.  He has a family history of PCa, with ***.   He does not have a family history of PCa.***    Score:  1-7 Mild 8-19 Moderate 20-35 Severe   Erectile dysfunction His SHIM score is ***, which is ***.   His previous SHIM score was ***.  He has been having difficulty with erections for ***.   His major complaint is ***.  His libido is ***.   His risk factors for ED are age, BPH, testosterone deficiency and smoking.  He denies any painful erections or curvatures with his erections.   He is still having/no longer having spontaneous erections.  He has tried *** in the past.       Score: 1-7 Severe ED 8-11 Moderate ED 12-16 Mild-Moderate ED 17-21 Mild ED 22-25 No ED      PMH: No past medical history on file.  Surgical History: Past Surgical History:  Procedure Laterality Date  . CATARACT EXTRACTION Right 1976    Home Medications:  Allergies as of 10/03/2017   No Known Allergies     Medication List        Accurate as of 10/03/17  5:56 AM. Always use your most recent med list.          diphenhydrAMINE 25 mg capsule Commonly known as:  BENADRYL Take 1 capsule (25 mg total) by mouth every 4  (four) hours as needed.   methadone 10 MG/5ML solution Commonly known as:  DOLOPHINE Take 115 mg by mouth daily.   predniSONE 20 MG tablet Commonly known as:  DELTASONE Take 3 tables for day 1-5. Take 2 tablets day 6-10. Take 1 tablet day 11-15   terbinafine 250 MG tablet Commonly known as:  LAMISIL   testosterone cypionate 200 MG/ML injection Commonly known as:  DEPOTESTOSTERONE CYPIONATE Inject 1 mL (200 mg total) into the muscle every 14 (fourteen) days.       Allergies: No Known Allergies  Family History: Family History  Problem Relation Age of Onset  . Prostate cancer Neg Hx   . Bladder Cancer Neg Hx   . Kidney cancer Neg Hx     Social History:  reports that he has been smoking. He has never used smokeless tobacco. He reports that he does not drink alcohol or use drugs.  ROS:                                        Physical Exam: There were no vitals taken for this visit.  Constitutional: Well nourished. Alert and oriented, No acute distress. HEENT:  AT, moist mucus membranes. Trachea midline, no masses. Cardiovascular:  No clubbing, cyanosis, or edema. Respiratory: Normal respiratory effort, no increased work of breathing. GI: Abdomen is soft, non tender, non distended, no abdominal masses. Liver and spleen not palpable.  No hernias appreciated.  Stool sample for occult testing is not indicated.   GU: No CVA tenderness.  No bladder fullness or masses.  Patient with circumcised/uncircumcised phallus. ***Foreskin easily retracted***  Urethral meatus is patent.  No penile discharge. No penile lesions or rashes. Scrotum without lesions, cysts, rashes and/or edema.  Testicles are located scrotally bilaterally. No masses are appreciated in the testicles. Left and right epididymis are normal. Rectal: Patient with  normal sphincter tone. Anus and perineum without scarring or rashes. No rectal masses are appreciated. Prostate is approximately ***  grams, *** nodules are appreciated. Seminal vesicles are normal. Skin: No rashes, bruises or suspicious lesions. Lymph: No cervical or inguinal adenopathy. Neurologic: Grossly intact, no focal deficits, moving all 4 extremities. Psychiatric: Normal mood and affect.  Laboratory Data: PSA Trend  0.3 in 07/2016  0.2 in 01/2017 Lab Results  Component Value Date   WBC 6.9 02/08/2017   HGB 14.4 02/08/2017   HCT 42.9 02/08/2017   MCV 91 02/08/2017   PLT 214 03/23/2015    Lab Results  Component Value Date   CREATININE 1.14 03/23/2015    No results found for: PSA  Lab Results  Component Value Date   TESTOSTERONE 144 (L) 02/08/2017    No results found for: HGBA1C  Urinalysis    Component Value Date/Time   COLORURINE Yellow 05/09/2011 1750   APPEARANCEUR Clear 08/04/2016 1321   LABSPEC 1.013 05/09/2011 1750   PHURINE 7.0 05/09/2011 1750   GLUCOSEU Negative 08/04/2016 1321   GLUCOSEU Negative 05/09/2011 1750   HGBUR Negative 05/09/2011 1750   BILIRUBINUR Negative 08/04/2016 1321   BILIRUBINUR Negative 05/09/2011 1750   KETONESUR Negative 05/09/2011 1750   PROTEINUR Negative 08/04/2016 1321   PROTEINUR Negative 05/09/2011 1750   NITRITE Negative 08/04/2016 1321   NITRITE Negative 05/09/2011 1750   LEUKOCYTESUR Negative 08/04/2016 1321   LEUKOCYTESUR Trace 05/09/2011 1750    Assessment & Plan:    1. Testosterone deficiency  Most recent testosterone level is 144 ng/dL in 01/2017 (goal 450-600 ng/dL) Continue ***  Testosterone cypionate 200 mg, 1 cc every two weeks - received in office due to past history of non compliance RTC in 6 months for HCT/HBG, testosterone (one week after injection) and exam  2. BPH with LUTS  - IPSS score is ***, it is stable/improving/worsening  - Continue conservative management, avoiding bladder irritants and timed voiding's  - Initiate alpha-blocker (***), discussed side effects  - Initiate 5 alpha reductase inhibitor (***), discussed  side effects  - Continue tamsulosin 0.4 mg daily, alfuzosin 10 mg daily, Rapaflo 8 mg daily, terazosin, doxazosin, Cialis 5 mg daily and finasteride 5 mg daily, dutasteride 0.5 mg daily***  - Cannot tolerate medication or medication failure, schedule cystoscopy  - RTC in *** months for IPSS, PSA, PVR and exam, as testosterone therapy can cause prostate enlargement and worsen LUTS  3. Erectile dysfunction:     -SHIM score is ***  -continue Viagra 100 mg, 1 tablet 2 hours prior to intercourse on an empty stomach, Cialis 20 mg, one tablet 2 hours prior to intercourse on an empty stomach Levitra, Stendra, Trimix, sildenafil 20 mg, 3-5 tablets 2 hours prior to intercourse on an empty stomach: refills given  -RTC in 6 months for SHIM score and exam, as testosterone therapy can affect erections  No follow-ups on file.  Zara Council, PA-C  [facility] [address] [place], [place] [address] [phone]

## 2022-01-17 ENCOUNTER — Ambulatory Visit
Admission: EM | Admit: 2022-01-17 | Discharge: 2022-01-17 | Disposition: A | Discharge status: Home / Self Care | Payer: Medicaid Other

## 2022-01-17 DIAGNOSIS — J329 Chronic sinusitis, unspecified: Secondary | ICD-10-CM | POA: Diagnosis not present

## 2022-01-17 DIAGNOSIS — B9689 Other specified bacterial agents as the cause of diseases classified elsewhere: Secondary | ICD-10-CM | POA: Diagnosis not present

## 2022-01-17 HISTORY — DX: Malignant neoplasm of pharynx, unspecified: C14.0

## 2022-01-17 MED ORDER — DOXYCYCLINE HYCLATE 100 MG PO CAPS: 100.0000 mg | ORAL_CAPSULE | Freq: Two times a day (BID) | ORAL | 0 refills | Status: AC

## 2022-01-17 NOTE — Discharge Instructions (Signed)
You were seen for sinus pressure and are being treated for bacterial sinusitis.  - Take the antibiotics as prescribed until they're finished. If you think you're having a reaction, stop the medication, take benadryl and go to the nearest urgent care/emergency room. Take a probiotic while taking the antibiotic to decrease the chances of stomach upset.  - Use antihistamines, such as cetirizine (Zyrtec) or loratadine (Claritin), to help with the drainage.  Over-the-counter Flonase 2 times a day will also help with sinus pressure.  Treat the pain with over-the-counter Tylenol and ibuprofen as needed.  Monitor your symptoms to make sure that they get better.   Take care, Dr. Marland Kitchen, NP-c

## 2022-01-17 NOTE — ED Triage Notes (Signed)
Patient with c/o cough, congestion, and body aches. Patient states he also has a boil in his groin area.

## 2022-01-17 NOTE — ED Provider Notes (Signed)
[facility] - [place], [place]   Name: Dustin Webb DOB: 01-31-1975 MRN: [id number] CSN: [id number] PCP: [facility] [place] date and time:  01/17/22 1107  Chief Complaint:  Cough and Generalized Body Aches   NOTE: Prior to seeing the patient today, I have reviewed the triage nursing documentation and vital signs. Clinical staff has updated patient's PMH/PSHx, current medication list, and drug allergies/intolerances to ensure comprehensive history available to assist in medical decision making.   History:   HPI: Dustin Webb is a 46 y.o. male who presents today with complaints of worsening sinus congestion and bodyaches.  Patient states his symptoms started over 1 week ago.  His primary symptoms are nasal congestion, sore throat and mild feelings of fever.  Temperature was not recorded at that time.  Over the course of the 24 hours, he noticed a cough progressing, worsening body aches and chills.  He checked his temperature before presenting here and reported a Tmax of 101 F.  He has been treating his symptoms with the occasional NyQuil, but no other medications.  No recent antibiotics.  Patient is a daily smoker.   Past Medical History:  Diagnosis Date   Throat cancer [facility])     Past Surgical History:  Procedure Laterality Date   CATARACT EXTRACTION Right 1976    Family History  Problem Relation Age of Onset   Prostate cancer Neg Hx    Bladder Cancer Neg Hx    Kidney cancer Neg Hx     Social History   Tobacco Use   Smoking status: Every Day    Packs/day: 0.50    Types: Cigarettes   Smokeless tobacco: Never  Vaping Use   Vaping Use: Never used  Substance Use Topics   Alcohol use: No   Drug use: No    There are no problems to display for this patient.   Home Medications:    No outpatient medications have been marked as taking for the 01/17/22 encounter [facility] Encounter).    Allergies:   Patient has no known  allergies.  Review of Systems (ROS): Review of Systems  Constitutional:  Positive for fatigue and fever. Negative for activity change, appetite change and diaphoresis.  HENT:  Positive for congestion, postnasal drip, sinus pressure, sinus pain and sore throat. Negative for ear discharge, ear pain, facial swelling, hearing loss and sneezing.   Eyes:  Negative for pain.  Respiratory:  Positive for cough. Negative for shortness of breath and wheezing.   Gastrointestinal:  Negative for constipation, nausea and vomiting.  Musculoskeletal:  Positive for myalgias.  All other systems reviewed and are negative.    Vital Signs: Today's Vitals   01/17/22 1356 01/17/22 1358  BP:  130/83  Pulse: 99   Resp: 18   Temp: 98.5 F (36.9 C)   SpO2: 98%   PainSc: 0-No pain     Physical Exam: Physical Exam Vitals and nursing note reviewed.  Constitutional:      Appearance: Normal appearance.  HENT:     Right Ear: Tympanic membrane normal.     Left Ear: Tympanic membrane normal.     Nose: Congestion present.     Right Turbinates: Swollen.     Left Turbinates: Swollen.     Right Sinus: No maxillary sinus tenderness or frontal sinus tenderness.     Left Sinus: No maxillary sinus tenderness or frontal sinus tenderness.  Cardiovascular:     Rate and Rhythm: Normal rate and regular rhythm.  Pulses: Normal pulses.     Heart sounds: Normal heart sounds.  Pulmonary:     Effort: Pulmonary effort is normal.     Breath sounds: Normal breath sounds.  Skin:    General: Skin is warm and dry.  Neurological:     General: No focal deficit present.     Mental Status: He is alert and oriented to person, place, and time.  Psychiatric:        Mood and Affect: Mood normal.        Behavior: Behavior normal.      Urgent Care Treatments / Results:   LABS: PLEASE NOTE: all labs that were ordered this encounter are listed, however only abnormal results are displayed. Labs Reviewed - No data to  display  EKG: -None  RADIOLOGY: No results found.  PROCEDURES: Procedures  MEDICATIONS RECEIVED THIS VISIT: Medications - No data to display  PERTINENT CLINICAL COURSE NOTES/UPDATES:   Initial Impression / Assessment and Plan / Urgent Care Course:  Pertinent labs & imaging results that were available during my care of the patient were personally reviewed by me and considered in my medical decision making (see lab/imaging section of note for values and interpretations).  Dustin Webb is a 46 y.o. male who presents to [facility] today with complaints of nasal congestion, diagnosed with bacterial sinusitis, and treated as such with the medications below. NP and patient reviewed discharge instructions below during visit.   Patient is well appearing overall in clinic today. He does not appear to be in any acute distress. Presenting symptoms (see HPI) and exam as documented above.   I have reviewed the follow up and strict return precautions for any new or worsening symptoms. Patient is aware of symptoms that would be deemed urgent/emergent, and would thus require further evaluation either here or in the emergency department. At the time of discharge, he verbalized understanding and consent with the discharge plan as it was reviewed with him. All questions were fielded by provider and/or clinic staff prior to patient discharge.    Final Clinical Impressions / Urgent Care Diagnoses:   Final diagnoses:  Bacterial sinusitis    New Prescriptions:  [place] Controlled Substance Registry consulted? Not Applicable  Meds ordered this encounter  Medications   doxycycline (VIBRAMYCIN) 100 MG capsule    Sig: Take 1 capsule (100 mg total) by mouth 2 (two) times daily.    Dispense:  20 capsule    Refill:  0      Discharge Instructions      You were seen for sinus pressure and are being treated for bacterial sinusitis.  - Take the antibiotics as prescribed until they're finished.  If you think you're having a reaction, stop the medication, take benadryl and go to the nearest urgent care/emergency room. Take a probiotic while taking the antibiotic to decrease the chances of stomach upset.  - Use antihistamines, such as cetirizine (Zyrtec) or loratadine (Claritin), to help with the drainage.  Over-the-counter Flonase 2 times a day will also help with sinus pressure.  Treat the pain with over-the-counter Tylenol and ibuprofen as needed.  Monitor your symptoms to make sure that they get better.   Take care, Dr. Marland Kitchen, NP-c     Recommended Follow up Care:  Patient encouraged to follow up with the following provider within the specified time frame, or sooner as dictated by the severity of his symptoms. As always, he was instructed that for any urgent/emergent care needs, he should seek  care either here or in the emergency department for more immediate evaluation.   Gertie Baron, DNP, NP-c   Gertie Baron, NP 01/17/22 1424
